# Patient Record
Sex: Female | Born: 1985 | Race: White | Hispanic: No | Marital: Single | Smoking: Current every day smoker
Health system: Southern US, Community
[De-identification: ages and names within clinical notes are randomized; demographics above are authoritative.]

## PROBLEM LIST (undated history)

## (undated) DIAGNOSIS — A6000 Herpesviral infection of urogenital system, unspecified: Secondary | ICD-10-CM

## (undated) DIAGNOSIS — N7011 Chronic salpingitis: Secondary | ICD-10-CM

## (undated) DIAGNOSIS — D649 Anemia, unspecified: Secondary | ICD-10-CM

## (undated) DIAGNOSIS — N12 Tubulo-interstitial nephritis, not specified as acute or chronic: Secondary | ICD-10-CM

## (undated) HISTORY — DX: Anemia, unspecified: D64.9

## (undated) HISTORY — DX: Tubulo-interstitial nephritis, not specified as acute or chronic: N12

## (undated) HISTORY — DX: Chronic salpingitis: N70.11

---

## 2011-07-26 ENCOUNTER — Emergency Department (HOSPITAL_COMMUNITY)
Admission: EM | Admit: 2011-07-26 | Discharge: 2011-07-27 | Disposition: A | Discharge status: Home / Self Care | Payer: BC Managed Care – PPO | Attending: Emergency Medicine | Admitting: Emergency Medicine

## 2011-07-26 ENCOUNTER — Emergency Department (HOSPITAL_COMMUNITY): Payer: BC Managed Care – PPO

## 2011-07-26 ENCOUNTER — Encounter: Payer: Self-pay | Admitting: Emergency Medicine

## 2011-07-26 DIAGNOSIS — M25579 Pain in unspecified ankle and joints of unspecified foot: Secondary | ICD-10-CM | POA: Insufficient documentation

## 2011-07-26 DIAGNOSIS — M79609 Pain in unspecified limb: Secondary | ICD-10-CM | POA: Insufficient documentation

## 2011-07-26 DIAGNOSIS — J45909 Unspecified asthma, uncomplicated: Secondary | ICD-10-CM | POA: Insufficient documentation

## 2011-07-26 DIAGNOSIS — M7662 Achilles tendinitis, left leg: Secondary | ICD-10-CM

## 2011-07-26 DIAGNOSIS — M7660 Achilles tendinitis, unspecified leg: Secondary | ICD-10-CM | POA: Insufficient documentation

## 2011-07-26 HISTORY — DX: Herpesviral infection of urogenital system, unspecified: A60.00

## 2011-07-26 NOTE — ED Notes (Signed)
PT. REPORTS LEFT ANKLE PAIN / CRAMPING  ONSET THIS EVENING ,  DENIES INJURY OR FALL . AMBULATORY.

## 2011-07-27 MED ORDER — IBUPROFEN 800 MG PO TABS
800.0000 mg | ORAL_TABLET | Freq: Once | ORAL | Status: AC
  Administered 2011-07-27: 800 mg via ORAL
  Filled 2011-07-27: qty 1

## 2011-07-27 MED ORDER — IBUPROFEN 800 MG PO TABS: 800.0000 mg | ORAL_TABLET | Freq: Three times a day (TID) | ORAL | Status: AC | PRN

## 2011-07-27 MED ORDER — HYDROCODONE-ACETAMINOPHEN 5-325 MG PO TABS
1.0000 | ORAL_TABLET | Freq: Once | ORAL | Status: AC
  Administered 2011-07-27: 1 via ORAL
  Filled 2011-07-27: qty 1

## 2011-07-27 MED ORDER — HYDROCODONE-ACETAMINOPHEN 5-325 MG PO TABS: ORAL_TABLET | ORAL | Status: DC

## 2011-07-27 NOTE — ED Notes (Signed)
Rx given x2 D/c instructions reviewed w/ pt - pt denies any further questions or concerns at present.   

## 2011-07-27 NOTE — ED Provider Notes (Signed)
History     CSN: [id number] Arrival date & time: 07/26/2011 10:38 PM   First MD Initiated Contact with Patient 07/27/11 0040      Chief Complaint  Patient presents with  . Ankle Pain    Patient is a 25 y.o. female presenting with ankle pain. The history is provided by the patient.  Ankle Pain  The incident occurred 3 to 5 hours ago. The incident occurred at home. The pain is present in the left ankle and left heel. The pain is at a severity of 6/10.  Patient reports severe pain to her left ankle she attempted to get out of bed this evening at approximately 9:00. Denies specific injury. Reported sudden sharp pain to her left posterior ankle over the Achilles tendon appear Patient notes that she is a runner, and jogs approximately 3 miles a day.  Past Medical History  Diagnosis Date  . Asthma   . Herpes genitalia     History reviewed. No pertinent past surgical history.  No family history on file.  History  Substance Use Topics  . Smoking status: Current Everyday Smoker  . Smokeless tobacco: Not on file  . Alcohol Use: Yes    OB History    Grav Para Term Preterm Abortions TAB SAB Ect Mult Living                  Review of Systems  Constitutional: Negative.   HENT: Negative.   Eyes: Negative.   Respiratory: Negative.   Cardiovascular: Negative.   Gastrointestinal: Negative.   Genitourinary: Negative.   Musculoskeletal: Negative.   Skin: Negative.   Neurological: Negative.   Hematological: Negative.   Psychiatric/Behavioral: Negative.     Allergies  Review of patient's allergies indicates no known allergies.  Home Medications   Current Outpatient Rx  Name Route Sig Dispense Refill  . ACYCLOVIR 200 MG PO CAPS Oral Take 200 mg by mouth 2 (two) times daily.      . DROSPIRENONE-ETHINYL ESTRADIOL 3-0.02 MG PO TABS Oral Take 1 tablet by mouth daily.        BP 100/65  Pulse 74  Temp 98.5 F (36.9 C)  Resp 16  SpO2 98%  LMP 07/12/2011  Physical Exam    Constitutional: She is oriented to person, place, and time. She appears well-developed and well-nourished.  HENT:  Head: Normocephalic and atraumatic.  Eyes: Conjunctivae are normal.  Cardiovascular: Normal rate.   Pulmonary/Chest: Effort normal.  Musculoskeletal: Normal range of motion.       Feet:       TTP over achilles tendon and medial posterior (L) ankle. . Negative Thompson's Test. No swelling or crepitus.   Neurological: She is alert and oriented to person, place, and time.  Skin: Skin is warm and dry. No erythema.  Psychiatric: She has a normal mood and affect.    ED Course  Procedures Discussed impression the patient. Will treat patient for Achilles tendinopathy. Ace wrap applied by me. Will discharge home with a seven-day regimenof anti-inflammatories and medication for pain. Will provide her with an ortho referral for follow up if symptoms do not improve. Patient is agreeable with plan.  Labs Reviewed - No data to display Dg Ankle Complete Left  07/26/2011  *RADIOLOGY REPORT*  Clinical Data: Ankle pain  LEFT ANKLE COMPLETE - 3+ VIEW  Comparison: None.  Findings: Three-view exam of the left ankle shows no evidence for fracture.  No subluxation.  Ankle mortise is preserved.  Sclerotic focus in the  medial malleolus has benign features and is probably a bone island.  IMPRESSION: No acute bony findings.  Original Report Authenticated By: ERIC A. MANSELL, M.D.     No diagnosis found.    MDM  HPI, Hx and PE c/w likely Achilles Tendonopathy       Leanne Chang, NP 07/27/11 [phone]

## 2011-07-27 NOTE — ED Provider Notes (Signed)
Medical screening examination/treatment/procedure(s) were performed by non-physician practitioner and as supervising physician I was immediately available for consultation/collaboration.   Shelda Jakes, MD 07/27/11 2004

## 2013-07-15 ENCOUNTER — Ambulatory Visit (INDEPENDENT_AMBULATORY_CARE_PROVIDER_SITE_OTHER): Payer: Self-pay | Admitting: Internal Medicine

## 2013-07-15 VITALS — BP 134/82 | HR 123 | Temp 102.9°F | Resp 20 | Ht 67.0 in | Wt 135.4 lb

## 2013-07-15 DIAGNOSIS — N10 Acute tubulo-interstitial nephritis: Secondary | ICD-10-CM

## 2013-07-15 DIAGNOSIS — R509 Fever, unspecified: Secondary | ICD-10-CM

## 2013-07-15 DIAGNOSIS — M549 Dorsalgia, unspecified: Secondary | ICD-10-CM

## 2013-07-15 LAB — POCT CBC
Granulocyte percent: 82.9 %G — AB (ref 37–80)
MCV: 95.1 fL (ref 80–97)
MID (cbc): 1.2 — AB (ref 0–0.9)
POC Granulocyte: 14.6 — AB (ref 2–6.9)
POC LYMPH PERCENT: 10.1 %L (ref 10–50)
POC MID %: 7 %M (ref 0–12)
Platelet Count, POC: 138 10*3/uL — AB (ref 142–424)
RDW, POC: 12.4 %

## 2013-07-15 LAB — POCT URINALYSIS DIPSTICK
Protein, UA: 30
Urobilinogen, UA: 4

## 2013-07-15 LAB — POCT UA - MICROSCOPIC ONLY
Casts, Ur, LPF, POC: NEGATIVE
Crystals, Ur, HPF, POC: NEGATIVE

## 2013-07-15 MED ORDER — KETOROLAC TROMETHAMINE 60 MG/2ML IM SOLN
60.0000 mg | Freq: Once | INTRAMUSCULAR | Status: AC
  Administered 2013-07-15: 60 mg via INTRAMUSCULAR

## 2013-07-15 MED ORDER — OXYCODONE-ACETAMINOPHEN 5-325 MG PO TABS: 1.0000 | ORAL_TABLET | Freq: Four times a day (QID) | ORAL | Status: DC | PRN

## 2013-07-15 MED ORDER — CIPROFLOXACIN HCL 500 MG PO TABS: 500.0000 mg | ORAL_TABLET | Freq: Two times a day (BID) | ORAL | Status: DC

## 2013-07-15 MED ORDER — CEFTRIAXONE SODIUM 1 G IJ SOLR
1.0000 g | Freq: Once | INTRAMUSCULAR | Status: AC
  Administered 2013-07-15: 1 g via INTRAMUSCULAR

## 2013-07-15 NOTE — Progress Notes (Signed)
Subjective:    Patient ID: Danielle Gonzalez, female    DOB: [birth year], 27 y.o.   MRN: [id number]  HPIc/o fever and back pain from LS to neck for 7 days,worse last 24hrs Mild ST No cough Nausea but no vom/diarr No gu sxt Aches in muscles No vis changes Feel waek and dizzy Chest hurts to deep breathe R post lat    Review of Systems     Objective:   Physical Exam BP 134/82  Pulse 123  Temp(Src) 102.9 F (39.4 C) (Oral)  Resp 20  Ht 5\' 7"  (1.702 m)  Wt 135 lb 6.4 oz (61.417 kg)  BMI 21.20 kg/m2  SpO2 97%  LMP 06/29/2013 Appears ill PERRLA/conj cl Tms,nose,thr clear No nodes Neck w/out rigidity Lungs clear Ht reg w/out M abd tender all over w/out masses,reb,guard Skin w/out rash Joints clear CN2-12 intact Pos cva tend R to perc  Results for orders placed in visit on 07/15/13  POCT CBC      Result Value Range   WBC 17.6 (*) 4.6 - 10.2 K/uL   Lymph, poc 1.8  0.6 - 3.4   POC LYMPH PERCENT 10.1  10 - 50 %L   MID (cbc) 1.2 (*) 0 - 0.9   POC MID % 7.0  0 - 12 %M   POC Granulocyte 14.6 (*) 2 - 6.9   Granulocyte percent 82.9 (*) 37 - 80 %G   RBC 3.48 (*) 4.04 - 5.48 M/uL   Hemoglobin 10.3 (*) 12.2 - 16.2 g/dL   HCT, POC 44.0 (*) 10.2 - 47.9 %   MCV 95.1  80 - 97 fL   MCH, POC 29.6  27 - 31.2 pg   MCHC 31.1 (*) 31.8 - 35.4 g/dL   RDW, POC 72.5     Platelet Count, POC 138 (*) 142 - 424 K/uL   MPV 8.1  0 - 99.8 fL  POCT UA - MICROSCOPIC ONLY      Result Value Range   WBC, Ur, HPF, POC 20-25     RBC, urine, microscopic 6-8     Bacteria, U Microscopic 2+     Mucus, UA neg     Epithelial cells, urine per micros TNTC     Crystals, Ur, HPF, POC neg     Casts, Ur, LPF, POC neg     Yeast, UA neg    POCT URINALYSIS DIPSTICK      Result Value Range   Color, UA yellow     Clarity, UA clear     Glucose, UA neg     Bilirubin, UA neg     Ketones, UA trace     Spec Grav, UA 1.010     Blood, UA small     pH, UA 6.0     Protein, UA 30     Urobilinogen, UA 4.0     Nitrite, UA neg     Leukocytes, UA small (1+)          Assessment & Plan:  Acute pyelonephritis - Plan: cefTRIAXone (ROCEPHIN) injection 1 g, Urine culture  Fever, unspecified - Plan: POCT CBC, POCT UA - Microscopic Only, POCT urinalysis dipstick, cefTRIAXone (ROCEPHIN) injection 1 g  Back pain - Plan: ketorolac (TORADOL) injection 60 mg  Meds ordered this encounter  Medications  . cefTRIAXone (ROCEPHIN) injection 1 g    Sig:   . ketorolac (TORADOL) injection 60 mg    Sig:   . oxyCODONE-acetaminophen (PERCOCET/ROXICET) 5-325 MG per tablet    Sig:  Take 1-2 tablets by mouth every 6 (six) hours as needed for severe pain.    Dispense:  30 tablet    Refill:  0  . ciprofloxacin (CIPRO) 500 MG tablet    Sig: Take 1 tablet (500 mg total) by mouth 2 (two) times daily.    Dispense:  20 tablet    Refill:  0   Close f/u

## 2013-07-16 ENCOUNTER — Telehealth: Payer: Self-pay

## 2013-07-16 ENCOUNTER — Ambulatory Visit: Payer: Self-pay | Admitting: Family Medicine

## 2013-07-16 VITALS — BP 104/58 | HR 113 | Temp 98.9°F | Resp 16 | Ht 65.5 in | Wt 133.6 lb

## 2013-07-16 DIAGNOSIS — M549 Dorsalgia, unspecified: Secondary | ICD-10-CM

## 2013-07-16 DIAGNOSIS — N10 Acute tubulo-interstitial nephritis: Secondary | ICD-10-CM

## 2013-07-16 DIAGNOSIS — R509 Fever, unspecified: Secondary | ICD-10-CM

## 2013-07-16 DIAGNOSIS — R112 Nausea with vomiting, unspecified: Secondary | ICD-10-CM

## 2013-07-16 LAB — POCT UA - MICROSCOPIC ONLY
Casts, Ur, LPF, POC: NEGATIVE
Crystals, Ur, HPF, POC: NEGATIVE
Mucus, UA: NEGATIVE
WBC, Ur, HPF, POC: NEGATIVE
Yeast, UA: NEGATIVE

## 2013-07-16 LAB — POCT URINALYSIS DIPSTICK
Bilirubin, UA: NEGATIVE
Glucose, UA: NEGATIVE
Ketones, UA: NEGATIVE
Nitrite, UA: NEGATIVE
Protein, UA: NEGATIVE
Spec Grav, UA: <=1.005
Urobilinogen, UA: 1
pH, UA: 6.5

## 2013-07-16 MED ORDER — CEFTRIAXONE SODIUM 1 G IJ SOLR
1.0000 g | Freq: Once | INTRAMUSCULAR | Status: AC
  Administered 2013-07-16: 1 g via INTRAMUSCULAR

## 2013-07-16 NOTE — Telephone Encounter (Signed)
Father called to say pt is in  Severe pain and he is stuck in traffic and trying to get home to her.   Best phone [phone]

## 2013-07-16 NOTE — Progress Notes (Signed)
   Subjective:    Patient ID: Danielle Gonzalez, female    DOB: 02/07/1986, 27 y.o.   MRN: [id number]  HPI Patient presents for f/u of acute pyelonephritis. She was seen here yesterday with fever, back pain, increased WBC count, and urinalysis findings concerning for acute pyelo. She was treated with rocephin and cipro. She presents today for recheck. Was feeling better this morning but then woke up from nap this afternoon and had more pain and also an episode of vomiting. Last took antibiotic this morning. Had increased pain after vomiting. Patient does continue to have intermittent mild nausea. Has been drinking water okay and keeping it down. Urinating okay, no blood in the urine. No abdominal pain currently. Significantly improved from yesterday overall according to father and MA. Has been able to keep down abx. Positive pleuritic chest pain but no SOB, cough.  Review of Systems  Constitutional: Positive for fever, activity change, appetite change and fatigue.  Cardiovascular: Positive for chest pain.  All other systems reviewed and are negative.      Objective:   Physical Exam  Constitutional: She is oriented to person, place, and time. She appears well-developed and well-nourished. She appears distressed.  HENT:  Head: Normocephalic and atraumatic.  Eyes: Conjunctivae are normal. Pupils are equal, round, and reactive to light. No scleral icterus.  Neck: Neck supple.  Cardiovascular: Regular rhythm, normal heart sounds and intact distal pulses.   Pulmonary/Chest: Effort normal and breath sounds normal. No respiratory distress. She has no wheezes. She has no rales.  Abdominal: Soft. She exhibits no distension. There is no tenderness. There is no rebound and no guarding.  Lymphadenopathy:    She has no cervical adenopathy.  Neurological: She is alert and oriented to person, place, and time.  Skin: Skin is warm and dry. She is not diaphoretic.  Psychiatric: She has a normal mood and affect.  Her behavior is normal.  She appears to be in moderate discomfort    Assessment & Plan:  #1. Acute Pyelonephritis - Repeat dose IM rocephin 1 g today - Continue PO cipro - Urine culture pending - Return tomorrow if continued fever, vomiting, not improving by 48 hrs abx - Improved today compared to yesterday.  - Also in ddx is pneumonia (pleuritic pain), bacteremia - ER return precautions given

## 2013-07-16 NOTE — Patient Instructions (Signed)
Thank you for coming in today We will give you one more shot of rocephin Continue ciprofloxacin We should have your urine culture results back by Friday  Please continue to push fluids gradually Okay to take ibuprofen or tylenol  Return tomorrow evening if persistent fever > 100.5, persistent vomiting, or just not getting better  Pyelonephritis, Adult Pyelonephritis is a kidney infection. In general, there are 2 main types of pyelonephritis:  Infections that come on quickly without any warning (acute pyelonephritis).  Infections that persist for a long period of time (chronic pyelonephritis). CAUSES  Two main causes of pyelonephritis are:  Bacteria traveling from the bladder to the kidney. This is a problem especially in pregnant women. The urine in the bladder can become filled with bacteria from multiple causes, including:  Inflammation of the prostate gland (prostatitis).  Sexual intercourse in females.  Bladder infection (cystitis).  Bacteria traveling from the bloodstream to the tissue part of the kidney. Problems that may increase your risk of getting a kidney infection include:  Diabetes.  Kidney stones or bladder stones.  Cancer.  Catheters placed in the bladder.  Other abnormalities of the kidney or ureter. SYMPTOMS   Abdominal pain.  Pain in the side or flank area.  Fever.  Chills.  Upset stomach.  Blood in the urine (dark urine).  Frequent urination.  Strong or persistent urge to urinate.  Burning or stinging when urinating. DIAGNOSIS  Your caregiver may diagnose your kidney infection based on your symptoms. A urine sample may also be taken. TREATMENT  In general, treatment depends on how severe the infection is.   If the infection is mild and caught early, your caregiver may treat you with oral antibiotics and send you home.  If the infection is more severe, the bacteria may have gotten into the bloodstream. This will require intravenous  (IV) antibiotics and a hospital stay. Symptoms may include:  High fever.  Severe flank pain.  Shaking chills.  Even after a hospital stay, your caregiver may require you to be on oral antibiotics for a period of time.  Other treatments may be required depending upon the cause of the infection. HOME CARE INSTRUCTIONS   Take your antibiotics as directed. Finish them even if you start to feel better.  Make an appointment to have your urine checked to make sure the infection is gone.  Drink enough fluids to keep your urine clear or pale yellow.  Take medicines for the bladder if you have urgency and frequency of urination as directed by your caregiver. SEEK IMMEDIATE MEDICAL CARE IF:   You have a fever or persistent symptoms for more than 2-3 days.  You have a fever and your symptoms suddenly get worse.  You are unable to take your antibiotics or fluids.  You develop shaking chills.  You experience extreme weakness or fainting.  There is no improvement after 2 days of treatment. MAKE SURE YOU:  Understand these instructions.  Will watch your condition.  Will get help right away if you are not doing well or get worse. Document Released: 07/31/2005 Document Revised: 01/30/2012 Document Reviewed: 01/04/2011 [facility] Patient Information 2014 [place], [place].

## 2013-07-16 NOTE — Telephone Encounter (Signed)
Please advise, ER or return here?

## 2013-07-17 LAB — URINE CULTURE

## 2015-11-16 ENCOUNTER — Encounter (HOSPITAL_COMMUNITY): Payer: Self-pay

## 2015-11-16 ENCOUNTER — Inpatient Hospital Stay (HOSPITAL_COMMUNITY)
Admission: AD | Admit: 2015-11-16 | Discharge: 2015-11-16 | Disposition: A | Discharge status: Home / Self Care | Payer: No Typology Code available for payment source | Source: Ambulatory Visit | Attending: Obstetrics & Gynecology | Admitting: Obstetrics & Gynecology

## 2015-11-16 ENCOUNTER — Emergency Department (HOSPITAL_COMMUNITY)
Admission: EM | Admit: 2015-11-16 | Discharge: 2015-11-16 | Disposition: A | Discharge status: Home / Self Care | Payer: No Typology Code available for payment source | Source: Home / Self Care | Attending: Emergency Medicine | Admitting: Emergency Medicine

## 2015-11-16 DIAGNOSIS — Z79899 Other long term (current) drug therapy: Secondary | ICD-10-CM | POA: Insufficient documentation

## 2015-11-16 DIAGNOSIS — N939 Abnormal uterine and vaginal bleeding, unspecified: Secondary | ICD-10-CM

## 2015-11-16 DIAGNOSIS — F17210 Nicotine dependence, cigarettes, uncomplicated: Secondary | ICD-10-CM | POA: Insufficient documentation

## 2015-11-16 DIAGNOSIS — Z8619 Personal history of other infectious and parasitic diseases: Secondary | ICD-10-CM | POA: Insufficient documentation

## 2015-11-16 DIAGNOSIS — Z3202 Encounter for pregnancy test, result negative: Secondary | ICD-10-CM | POA: Insufficient documentation

## 2015-11-16 DIAGNOSIS — J45909 Unspecified asthma, uncomplicated: Secondary | ICD-10-CM

## 2015-11-16 LAB — COMPREHENSIVE METABOLIC PANEL
ALT: 20 U/L (ref 14–54)
AST: 29 U/L (ref 15–41)
Albumin: 4.3 g/dL (ref 3.5–5.0)
Alkaline Phosphatase: 44 U/L (ref 38–126)
Anion gap: 8 (ref 5–15)
BILIRUBIN TOTAL: 0.9 mg/dL (ref 0.3–1.2)
BUN: 17 mg/dL (ref 6–20)
CHLORIDE: 105 mmol/L (ref 101–111)
CO2: 26 mmol/L (ref 22–32)
CREATININE: 0.84 mg/dL (ref 0.44–1.00)
Calcium: 9 mg/dL (ref 8.9–10.3)
Glucose, Bld: 92 mg/dL (ref 65–99)
POTASSIUM: 4.1 mmol/L (ref 3.5–5.1)
Sodium: 139 mmol/L (ref 135–145)
TOTAL PROTEIN: 7 g/dL (ref 6.5–8.1)

## 2015-11-16 LAB — CBC WITH DIFFERENTIAL/PLATELET
BASOS ABS: 0 10*3/uL (ref 0.0–0.1)
Basophils Relative: 0 %
EOS ABS: 0.2 10*3/uL (ref 0.0–0.7)
EOS PCT: 3 %
HCT: 36.1 % (ref 36.0–46.0)
HEMOGLOBIN: 12.9 g/dL (ref 12.0–15.0)
LYMPHS PCT: 18 %
Lymphs Abs: 1.4 10*3/uL (ref 0.7–4.0)
MCH: 32.2 pg (ref 26.0–34.0)
MCHC: 35.7 g/dL (ref 30.0–36.0)
MCV: 90 fL (ref 78.0–100.0)
Monocytes Absolute: 0.7 10*3/uL (ref 0.1–1.0)
Monocytes Relative: 9 %
NEUTROS PCT: 70 %
Neutro Abs: 5.6 10*3/uL (ref 1.7–7.7)
PLATELETS: 209 10*3/uL (ref 150–400)
RBC: 4.01 MIL/uL (ref 3.87–5.11)
RDW: 12 % (ref 11.5–15.5)
WBC: 8 10*3/uL (ref 4.0–10.5)

## 2015-11-16 LAB — CBC
HEMATOCRIT: 36.4 % (ref 36.0–46.0)
HEMOGLOBIN: 12.8 g/dL (ref 12.0–15.0)
MCH: 31.8 pg (ref 26.0–34.0)
MCHC: 35.2 g/dL (ref 30.0–36.0)
MCV: 90.3 fL (ref 78.0–100.0)
Platelets: 222 10*3/uL (ref 150–400)
RBC: 4.03 MIL/uL (ref 3.87–5.11)
RDW: 12.1 % (ref 11.5–15.5)
WBC: 7.1 10*3/uL (ref 4.0–10.5)

## 2015-11-16 LAB — POC URINE PREG, ED: PREG TEST UR: NEGATIVE

## 2015-11-16 MED ORDER — MEDROXYPROGESTERONE ACETATE 10 MG PO TABS
10.0000 mg | ORAL_TABLET | Freq: Once | ORAL | Status: AC
  Administered 2015-11-16: 10 mg via ORAL
  Filled 2015-11-16: qty 1

## 2015-11-16 MED ORDER — MEDROXYPROGESTERONE ACETATE 10 MG PO TABS: 10.0000 mg | ORAL_TABLET | Freq: Every day | ORAL | Status: DC

## 2015-11-16 NOTE — Discharge Instructions (Signed)

## 2015-11-16 NOTE — MAU Note (Signed)
Pt was seen @ [facility] this morning, pt states she was sent to MAU.  Pt C/O heavy bleeding since 2300 last night.  Denies pain.

## 2015-11-16 NOTE — MAU Provider Note (Signed)
Chief Complaint: Vaginal Bleeding   First Provider Initiated Contact with Patient 11/16/15 1534     SUBJECTIVE HPI: Danielle Gonzalez is a 30 y.o. non-pregnant female who presents to Maternity Admissions reporting heavy vaginal bleeding since last night. Was seen at [facility] ED for this problem this morning. Hgb 12.9. ED MD consulted Dr. Macon Large OB/GYN who reviewed chart and determined that pt was stable for D/C w/ OP Gyn F/U. No [place] indicated, but pt's family was not satisfied with this plan and brought her to MAU.    Pt states she has soaked 10 tampons and 2 pads since last night. Normally has regular monthly periods w/ moderate flow. Has only missed one other period in her life last September. Missed period last month. Had normal period last week, stopped for a few days, then started bleeding heavily last night.   Duration: >24 hours Associated signs and symptoms: Pos for Mild cramping. Neg for dizziness, passing clots, vaginal discharge.   Past Medical History  Diagnosis Date  . Asthma   . Herpes genitalia    OB History  No data available   History reviewed. No pertinent past surgical history. Social History   Social History  . Marital Status: Single    Spouse Name: N/A  . Number of Children: N/A  . Years of Education: N/A   Occupational History  . Not on file.   Social History Main Topics  . Smoking status: Current Every Day Smoker -- 0.50 packs/day    Types: Cigarettes  . Smokeless tobacco: Never Used  . Alcohol Use: Yes     Comment: 1-2 beers daily  . Drug Use: No  . Sexual Activity: Not on file   Other Topics Concern  . Not on file   Social History Narrative   No current facility-administered medications on file prior to encounter.   Current Outpatient Prescriptions on File Prior to Encounter  Medication Sig Dispense Refill  . fexofenadine (ALLEGRA) 180 MG tablet Take 180 mg by mouth daily.    Marland Kitchen ibuprofen (ADVIL,MOTRIN) 200 MG tablet Take 600 mg by mouth  every 8 (eight) hours as needed for fever, headache, mild pain, moderate pain or cramping.    . IRON PO Take 1 tablet by mouth daily.     No Known Allergies  I have reviewed the past Medical Hx, Surgical Hx, Social Hx, Allergies and Medications.   Review of Systems  Constitutional: Negative for fever, chills and fatigue.  Gastrointestinal: Positive for abdominal pain. Negative for nausea, vomiting, diarrhea and constipation.  Genitourinary: Positive for vaginal bleeding and menstrual problem. Negative for dysuria, vaginal discharge, vaginal pain and pelvic pain.  Neurological: Positive for headaches (mild). Negative for dizziness, weakness and light-headedness.    OBJECTIVE Patient Vitals for the past 24 hrs:  BP Temp Temp src Pulse Resp  11/16/15 1458 98/61 mmHg 98.1 F (36.7 C) Oral 84 16   Constitutional: Well-developed, well-nourished female in no acute distress. No pallor.  Cardiovascular: normal rate Respiratory: normal rate and effort.  GI: Abd soft, non-tender. MS: Extremities nontender, no edema, normal ROM Neurologic: Alert and oriented x 4.  GU: Deferred due to recent exam. Moderate amount of blood on pad per RN.  LAB RESULTS Results for orders placed or performed during the hospital encounter of 11/16/15 (from the past 24 hour(s))  CBC     Status: None   Collection Time: 11/16/15  4:26 PM  Result Value Ref Range   WBC 7.1 4.0 - 10.5 K/uL  RBC 4.03 3.87 - 5.11 MIL/uL   Hemoglobin 12.8 12.0 - 15.0 g/dL   HCT 21.336.4 08.636.0 - 57.846.0 %   MCV 90.3 78.0 - 100.0 fL   MCH 31.8 26.0 - 34.0 pg   MCHC 35.2 30.0 - 36.0 g/dL   RDW 46.912.1 62.911.5 - 52.815.5 %   Platelets 222 150 - 400 K/uL    IMAGING No results found.  MAU COURSE CBC.   Discussed history, exam, [facility] with Dr. Macon LargeAnyanwu. No indication for ultrasound at this time.  MDM - 30 year old non-pregnant female with isolated episode of abnormal uterine bleeding, hemodynamically stable, CBC stable. No  emergent condition present. No indication for extensive workup, ultrasound at this time.  ASSESSMENT 1. Abnormal uterine bleeding    PLAN Discharge home in stable condition per consult with Dr. Macon LargeAnyanwu. Bleeding Precautions Rx Provera. Discussed withdrawal bleed after Premarin stops. Brief discussion of indications for further workup and treatment for ongoing problems with abnormal uterine bleeding.     Follow-up Information    [facility]   Specialty:  Obstetrics and Gynecology   Why:  2 weeks if no improvement    Contact information:   [address] [place] [phone]      Follow [facility] with [facility] [place] [facility].   Why:  As needed in emergencies   Contact information:   [address] [id number] mc [place] [phone] [phone]       Medication List    TAKE these medications        fexofenadine 180 MG tablet  Commonly known as:  ALLEGRA  Take 180 mg by mouth daily.     ibuprofen 200 MG tablet  Commonly known as:  ADVIL,MOTRIN  Take 600 mg by mouth every 8 (eight) hours as needed for fever, headache, mild pain, moderate pain or cramping.     IRON PO  Take 1 tablet by mouth daily.     medroxyPROGESTERone 10 MG tablet  Commonly known as:  PROVERA  Take 1 tablet (10 mg total) by mouth daily. Use for ten days     multivitamin with minerals Tabs tablet  Take 1 tablet by mouth daily.       [place] Siegfried Vi[place], [place] 11/16/2015  5:58 PM

## 2015-11-16 NOTE — ED Notes (Signed)
Patient reports that she began having heavy vaginal bleeding with clots. Patient states she had a period last week, but had not had one for 2 months prior to last week.

## 2015-11-16 NOTE — ED Provider Notes (Signed)
CSN: [id number]     Arrival date & time 11/16/15  1012 History   First MD Initiated Contact with Patient 11/16/15 1037     Chief Complaint  Patient presents with  . Vaginal Bleeding      Patient is a 30 y.o. female presenting with vaginal bleeding. The history is provided by the patient.  Vaginal Bleeding Associated symptoms: no abdominal pain, no back pain and no nausea   Patient presents with vaginal bleeding. States she's been off her birth control pills for a couple years. States that around 2 weeks ago she had a normal menses however she had missed the month before that. States that that menses lasted around 5 days was normal. She states that yesterday she began to have heavy bleeding. States she's passed some clots. States she feels fine otherwise. States there is a change she could be pregnant. No other bleeding. No bruising easily. No abdominal pain. No lightheadedness or dizziness. States she also missed a menses in September.  Past Medical History  Diagnosis Date  . Asthma   . Herpes genitalia    History reviewed. No pertinent past surgical history. History reviewed. No pertinent family history. Social History  Substance Use Topics  . Smoking status: Current Every Day Smoker -- 0.50 packs/day    Types: Cigarettes  . Smokeless tobacco: Never Used  . Alcohol Use: Yes     Comment: 1-2 beers daily   OB History    No data available     Review of Systems  Constitutional: Negative for activity change and appetite change.  Eyes: Negative for pain.  Respiratory: Negative for chest tightness and shortness of breath.   Cardiovascular: Negative for chest pain and leg swelling.  Gastrointestinal: Negative for nausea, abdominal pain and diarrhea.  Genitourinary: Positive for vaginal bleeding. Negative for flank pain.  Musculoskeletal: Negative for back pain and neck stiffness.  Skin: Negative for rash.  Neurological: Negative for weakness, numbness and headaches.   Psychiatric/Behavioral: Negative for behavioral problems.      Allergies  Review of patient's allergies indicates no known allergies.  Home Medications   Prior to Admission medications   Medication Sig Start Date End Date Taking? Authorizing Provider  fexofenadine (ALLEGRA) 180 MG tablet Take 180 mg by mouth daily.   Yes Historical Provider, MD  ibuprofen (ADVIL,MOTRIN) 200 MG tablet Take 600 mg by mouth every 8 (eight) hours as needed for fever, headache, mild pain, moderate pain or cramping.   Yes Historical Provider, MD  IRON PO Take 1 tablet by mouth daily.   Yes Historical Provider, MD  Multiple Vitamins-Minerals (ADULT GUMMY) CHEW Chew 2 capsules by mouth daily.   Yes Historical Provider, MD   BP 108/50 mmHg  Pulse 91  Temp(Src) 98.2 F (36.8 C) (Oral)  Resp 18  SpO2 99%  LMP 11/15/2015 Physical Exam  Constitutional: She appears well-developed.  HENT:  Head: Atraumatic.  Neck: Neck supple.  Cardiovascular: Normal rate.   Pulmonary/Chest: Effort normal.  Abdominal: Soft.  Neurological: She is alert.  Skin: Skin is warm.  Pelvic exam showed some pooled blood was small clot. Slight cervical bleeding. No cervical motion tenderness or mass.  ED Course  Procedures (including critical care time) Labs Review Labs Reviewed  COMPREHENSIVE METABOLIC PANEL  CBC WITH DIFFERENTIAL/PLATELET  RPR  HIV ANTIBODY (ROUTINE TESTING)  POC URINE PREG, ED    Imaging Review No results found. I have personally reviewed and evaluated these images and lab results as part of my medical decision-making.  EKG Interpretation None      MDM   Final diagnoses:  Vaginal bleeding    Patient with vaginal bleeding. Began yesterday. Normal hemoglobin vitals. Patient is asymptomatic except for the bleeding. Patient appears stable to discharge to follow-up with women's clinic. Negative pregnancy test. Discussed with the patient's mother in [place] and a friend of hers who is a Engineer, civil (consulting).  They think that she needs emergent ultrasound and emergent consult by gynecology. I informed them at this point the patient appears stable and will need follow-up. I discussed with the gynecologist on call for [facility]. She took the patient's information in the office well follow-up for appointment. She was told to follow-up with women's hospital if she continues to bleed. Patient appears satisfied with this but some family members have been rolling their eyes at me. The gynecologist stated it could be a week or 2 before the appointment gets done.    Benjiman Core, MD 11/16/15 1316

## 2015-11-17 LAB — RPR: RPR: NONREACTIVE

## 2015-11-17 LAB — HIV ANTIBODY (ROUTINE TESTING W REFLEX): HIV Screen 4th Generation wRfx: NONREACTIVE

## 2015-11-29 ENCOUNTER — Encounter: Payer: No Typology Code available for payment source | Admitting: Student

## 2016-01-18 ENCOUNTER — Ambulatory Visit (INDEPENDENT_AMBULATORY_CARE_PROVIDER_SITE_OTHER): Payer: No Typology Code available for payment source | Admitting: Emergency Medicine

## 2016-01-18 VITALS — BP 128/80 | HR 98 | Temp 97.6°F | Resp 17 | Ht 66.0 in | Wt 140.0 lb

## 2016-01-18 DIAGNOSIS — N3000 Acute cystitis without hematuria: Secondary | ICD-10-CM

## 2016-01-18 DIAGNOSIS — R300 Dysuria: Secondary | ICD-10-CM | POA: Diagnosis not present

## 2016-01-18 DIAGNOSIS — M545 Low back pain, unspecified: Secondary | ICD-10-CM

## 2016-01-18 LAB — POCT CBC
GRANULOCYTE PERCENT: 89.3 % — AB (ref 37–80)
HCT, POC: 38.3 % (ref 37.7–47.9)
Hemoglobin: 13.7 g/dL (ref 12.2–16.2)
Lymph, poc: 1.2 (ref 0.6–3.4)
MCH: 32.1 pg — AB (ref 27–31.2)
MCHC: 35.6 g/dL — AB (ref 31.8–35.4)
MCV: 90.2 fL (ref 80–97)
MID (CBC): 0.9 (ref 0–0.9)
MPV: 6.9 fL (ref 0–99.8)
PLATELET COUNT, POC: 206 10*3/uL (ref 142–424)
POC Granulocyte: 17 — AB (ref 2–6.9)
POC LYMPH %: 6.2 % — AB (ref 10–50)
POC MID %: 4.5 % (ref 0–12)
RBC: 4.25 M/uL (ref 4.04–5.48)
RDW, POC: 12.2 %
WBC: 19 10*3/uL — AB (ref 4.6–10.2)

## 2016-01-18 LAB — POC MICROSCOPIC URINALYSIS (UMFC): MUCUS RE: ABSENT

## 2016-01-18 LAB — POCT URINALYSIS DIP (MANUAL ENTRY)
Bilirubin, UA: NEGATIVE
Glucose, UA: NEGATIVE
Ketones, POC UA: NEGATIVE
Nitrite, UA: NEGATIVE
Protein Ur, POC: 30 — AB
UROBILINOGEN UA: 0.2
pH, UA: 5

## 2016-01-18 LAB — POCT URINE PREGNANCY: Preg Test, Ur: NEGATIVE

## 2016-01-18 MED ORDER — CEFTRIAXONE SODIUM 1 G IJ SOLR
1.0000 g | Freq: Once | INTRAMUSCULAR | Status: AC
  Administered 2016-01-18: 1 g via INTRAMUSCULAR

## 2016-01-18 MED ORDER — CIPROFLOXACIN HCL 500 MG PO TABS: 500.0000 mg | ORAL_TABLET | Freq: Two times a day (BID) | ORAL | Status: DC

## 2016-01-18 NOTE — Patient Instructions (Addendum)
Urinary Tract Infection Urinary tract infections (UTIs) can develop anywhere along your urinary tract. Your urinary tract is your body's drainage system for removing wastes and extra water. Your urinary tract includes two kidneys, two ureters, a bladder, and a urethra. Your kidneys are a pair of bean-shaped organs. Each kidney is about the size of your fist. They are located below your ribs, one on each side of your spine. CAUSES Infections are caused by microbes, which are microscopic organisms, including fungi, viruses, and bacteria. These organisms are so small that they can only be seen through a microscope. Bacteria are the microbes that most commonly cause UTIs. SYMPTOMS  Symptoms of UTIs may vary by age and gender of the patient and by the location of the infection. Symptoms in young women typically include a frequent and intense urge to urinate and a painful, burning feeling in the bladder or urethra during urination. Older women and men are more likely to be tired, shaky, and weak and have muscle aches and abdominal pain. A fever may mean the infection is in your kidneys. Other symptoms of a kidney infection include pain in your back or sides below the ribs, nausea, and vomiting. DIAGNOSIS To diagnose a UTI, your caregiver will ask you about your symptoms. Your caregiver will also ask you to provide a urine sample. The urine sample will be tested for bacteria and white blood cells. White blood cells are made by your body to help fight infection. TREATMENT  Typically, UTIs can be treated with medication. Because most UTIs are caused by a bacterial infection, they usually can be treated with the use of antibiotics. The choice of antibiotic and length of treatment depend on your symptoms and the type of bacteria causing your infection. HOME CARE INSTRUCTIONS  If you were prescribed antibiotics, take them exactly as your caregiver instructs you. Finish the medication even if you feel better after  you have only taken some of the medication.  Drink enough water and fluids to keep your urine clear or pale yellow.  Avoid caffeine, tea, and carbonated beverages. They tend to irritate your bladder.  Empty your bladder often. Avoid holding urine for long periods of time.  Empty your bladder before and after sexual intercourse.  After a bowel movement, women should cleanse from front to back. Use each tissue only once. SEEK MEDICAL CARE IF:   You have back pain.  You develop a fever.  Your symptoms do not begin to resolve within 3 days. SEEK IMMEDIATE MEDICAL CARE IF:   You have severe back pain or lower abdominal pain.  You develop chills.  You have nausea or vomiting.  You have continued burning or discomfort with urination. MAKE SURE YOU:   Understand these instructions.  Will watch your condition.  Will get help right away if you are not doing well or get worse.   This information is not intended to replace advice given to you by your health care provider. Make sure you discuss any questions you have with your health care provider.   Document Released: 05/10/2005 Document Revised: 04/21/2015 Document Reviewed: 09/08/2011 Elsevier Interactive Patient Education 2016 Else[employer] Inc[employer] Pyelonephritis, Adult Pyelonephritis is a kidney infection. The kidneys are the organs that filter a person's blood and move waste out of the bloodstream and into the urine. Urine passes from the kidneys, through the ureters, and into the bladder. There are two main types of pyelonephritis:  Infections that come on quickly without any warning (acute pyelonephritis).  Infections that  last for a long period of time (chronic pyelonephritis). °In most cases, the infection clears up with treatment and does not cause further problems. More severe infections or chronic infections can sometimes spread to the bloodstream or lead to other problems with the kidneys. °CAUSES °This condition is usually  caused by: °· Bacteria traveling from the bladder to the kidney through infected urine. The urine in the bladder can become infected with bacteria from: °¨ Bladder infection (cystitis). °¨ Inflammation of the prostate gland (prostatitis). °¨ Sexual intercourse, in females. °· Bacteria traveling from the bloodstream to the kidney. °RISK FACTORS °This condition is more likely to develop in: °· Pregnant women. °· Older people. °· People who have diabetes. °· People who have kidney stones or bladder stones. °· People who have other abnormalities of the kidney or ureter. °· People who have a catheter placed in the bladder. °· People who have cancer. °· People who are sexually active. °· Women who use spermicides. °· People who have had a prior urinary tract infection. °SYMPTOMS °Symptoms of this condition include: °· Frequent urination. °· Strong or persistent urge to urinate. °· Burning or stinging when urinating. °· Abdominal pain. °· Back pain. °· Pain in the side or flank area. °· Fever. °· Chills. °· Blood in the urine, or dark urine. °· Nausea. °· Vomiting. °DIAGNOSIS °This condition may be diagnosed based on: °· Medical history and physical exam. °· Urine tests. °· Blood tests. °You may also have imaging tests of the kidneys, such as an ultrasound or CT scan. °TREATMENT °Treatment for this condition may depend on the severity of the infection. °· If the infection is mild and is found early, you may be treated with antibiotic medicines taken by mouth. You will need to drink fluids to remain hydrated. °· If the infection is more severe, you may need to stay in the hospital and receive antibiotics given directly into a vein through an IV tube. You may also need to receive fluids through an IV tube if you are not able to remain hydrated. After your hospital stay, you may need to take oral antibiotics for a period of time. °Other treatments may be required, depending on the cause of the infection. °HOME CARE  INSTRUCTIONS °Medicines °· Take over-the-counter and prescription medicines only as told by your health care provider. °· If you were prescribed an antibiotic medicine, take it as told by your health care provider. Do not stop taking the antibiotic even if you start to feel better. °General Instructions °· Drink enough fluid to keep your urine clear or pale yellow. °· Avoid caffeine, tea, and carbonated beverages. They tend to irritate the bladder. °· Urinate often. Avoid holding in urine for long periods of time. °· Urinate before and after sex. °· After a bowel movement, women should cleanse from front to back. Use each tissue only once. °· Keep all follow-up visits as told by your health care provider. This is important. °SEEK MEDICAL CARE IF: °· Your symptoms do not get better after 2 days of treatment. °· Your symptoms get worse. °· You have a fever. °SEEK IMMEDIATE MEDICAL CARE IF: °· You are unable to take your antibiotics or fluids. °· You have shaking chills. °· You vomit. °· You have severe flank or back pain. °· You have extreme weakness or fainting. °  °This information is not intended to replace advice given to you by your health care provider. Make sure you discuss any questions you have with your health   care provider. °  °Document Released: 07/31/2005 Document Revised: 04/21/2015 Document Reviewed: 11/23/2014 °Elsevier Interactive Patient Education ©2016 Elsevier Inc. ° °

## 2016-01-18 NOTE — Progress Notes (Addendum)
By signing my name below, I, Danielle Gonzalez, attest that this documentation has been prepared under the direction and in the presence of Danielle ChrisSteven Kenny Stern, MD.  Electronically Signed: Arvilla MarketMesha Gonzalez, Medical Scribe. 01/18/2016. 1:37 PM.  Chief Complaint:  Chief Complaint  Patient presents with  . Dysuria    fever, chills, burning with urination x 2 days    HPI: Danielle ReaperBrianna Gonzalez is a 30 y.o. female [facility] of dysuria. Pt is concerned about getting kidney infection. Pt reports it began when she started her LMP Thursday. Pt reports dysuria, frequency, lower back pain onset 1 day, chills, fever, fatigue, and abdominal pain. Pt had a kidney infection about 3 years ago- end of November. She had it treated at the urgent care with 3 shots. Pt has been drinking lots of water.  Pt is not on birth control and uses condoms. Pt denies possibility of pregnancy. Pt denies have Pt states she has a bulging disk.  Pt had fever of 102 in 2014 she had acute pyelonephritis and treated with cipro and rocephin. Urine culture grew culture - E. Coli sensitive to all abx but penicillin  Past Medical History  Diagnosis Date  . Asthma   . Herpes genitalia    No past surgical history on file. Social History   Social History  . Marital Status: Single    Spouse Name: N/A  . Number of Children: N/A  . Years of Education: N/A   Social History Main Topics  . Smoking status: Current Every Day Smoker -- 0.50 packs/day    Types: Cigarettes  . Smokeless tobacco: Never Used  . Alcohol Use: Yes     Comment: 1-2 beers daily  . Drug Use: No  . Sexual Activity: Not Asked   Other Topics Concern  . None   Social History Narrative   No family history on file. No Known Allergies Prior to Admission medications   Medication Sig Start Date End Date Taking? Authorizing Provider  fexofenadine (ALLEGRA) 180 MG tablet Take 180 mg by mouth daily.   Yes Historical Provider, MD  ibuprofen  (ADVIL,MOTRIN) 200 MG tablet Take 600 mg by mouth every 8 (eight) hours as needed for fever, headache, mild pain, moderate pain or cramping.   Yes Historical Provider, MD  IRON PO Take 1 tablet by mouth daily.   Yes Historical Provider, MD  Multiple Vitamin (MULTIVITAMIN WITH MINERALS) TABS tablet Take 1 tablet by mouth daily.   Yes Historical Provider, MD     ROS: The patient denies night sweats, unintentional weight loss, chest pain, palpitations, wheezing, dyspnea on exertion, nausea, vomiting, hematuria, melena, numbness, weakness, or tingling. Pt reports fevers, chills, abdominal pain, and dysuria.  All other systems have been reviewed and were otherwise negative with the exception of those mentioned in the HPI and as above.    PHYSICAL EXAM: Filed Vitals:   01/18/16 1210  BP: 128/80  Pulse: 98  Temp: 97.6 F (36.4 C)  Resp: 17   Body mass index is 22.61 kg/(m^2).   General: Alert, no acute distress HEENT:  Normocephalic, atraumatic, oropharynx patent. Eye[facility] Cardiovascular:  Regular rate and rhythm, no rubs murmurs or gallops.  No Carotid bruits, radial pulse intact. No pedal edema.  Respiratory: Clear to auscultation bilaterally.  No wheezes, rales, or rhonchi.  No cyanosis, no use of accessory musculature Abdominal: No organomegaly, abdomen is soft and non-tender, positive bowel sounds.  No masses. Musculoskeletal: Gait intact. No edema, tenderness. Mild tenderness  2-3 inches above right SI joint Skin: No rashes. Neurologic: Facial musculature symmetric. Psychiatric: Patient acts appropriately throughout our interaction. Lymphatic: No cervical or submandibular lymphadenopathy   LABS: Results for orders placed or performed in visit on 01/18/16  POCT urinalysis dipstick  Result Value Ref Range   Color, UA yellow yellow   Clarity, UA cloudy (A) clear   Glucose, UA negative negative   Bilirubin, UA negative negative   Ketones, POC UA negative negative    Spec Grav, UA <=1.005    Blood, UA small (A) negative   pH, UA 5.0    Protein Ur, POC =30 (A) negative   Urobilinogen, UA 0.2    Nitrite, UA Negative Negative   Leukocytes, UA small (1+) (A) Negative  POCT Microscopic Urinalysis ([facility])  Result Value Ref Range   WBC,UR,HPF,POC Too numerous to count  (A) None WBC/hpf   RBC,UR,HPF,POC None None RBC/hpf   Bacteria Few (A) None, Too numerous to count   Mucus Absent Absent   Epithelial Cells, UR Per Microscopy Few (A) None, Too numerous to count cells/hpf  POCT CBC  Result Value Ref Range   WBC 19.0 (A) 4.6 - 10.2 K/uL   Lymph, poc 1.2 0.6 - 3.4   POC LYMPH PERCENT 6.2 (A) 10 - 50 %L   MID (cbc) 0.9 0 - 0.9   POC MID % 4.5 0 - 12 %M   POC Granulocyte 17.0 (A) 2 - 6.9   Granulocyte percent 89.3 (A) 37 - 80 %G   RBC 4.25 4.04 - 5.48 M/uL   Hemoglobin 13.7 12.2 - 16.2 g/dL   HCT, POC 16.1 09.6 - 47.9 %   MCV 90.2 80 - 97 fL   MCH, POC 32.1 (A) 27 - 31.2 pg   MCHC 35.6 (A) 31.8 - 35.4 g/dL   RDW, POC 04.5 %   Platelet Count, POC 206 142 - 424 K/uL   MPV 6.9 0 - 99.8 fL  POCT urine pregnancy  Result Value Ref Range   Preg Test, Ur Negative Negative   Meds ordered this encounter  Medications  . cefTRIAXone (ROCEPHIN) injection 1 g    Sig:   . ciprofloxacin (CIPRO) 500 MG tablet    Sig: Take 1 tablet (500 mg total) by mouth 2 (two) times daily.    Dispense:  14 tablet    Refill:  0    EKG/XRAY:   Primary read interpreted by [facility]. Cleta Gonzalez at [facility].   ASSESSMENT/PLAN: Patient developing a right-sided pyelonephritis. She was given a gram of Rocephin and started on Cipro twice a day. Previous culture was sensitive to Cipro. Will recheck first thing in the morning and repeat white count consider repeating her Rocephin shot.I personally performed the services described in this documentation, which was scribed in my presence. The recorded information has been reviewed and is accurate.    Gross sideeffects, risk and benefits, and  alternatives of medications d/w patient. Patient is aware that all medications have potential sideeffects and we are unable to predict every sideeffect or drug-drug interaction that may occur.  Danielle Chris MD 01/18/2016 1:24 PM

## 2016-01-19 ENCOUNTER — Ambulatory Visit (INDEPENDENT_AMBULATORY_CARE_PROVIDER_SITE_OTHER): Payer: No Typology Code available for payment source | Admitting: Emergency Medicine

## 2016-01-19 VITALS — BP 120/74 | HR 96 | Temp 98.7°F | Resp 17 | Ht 66.0 in | Wt 140.0 lb

## 2016-01-19 DIAGNOSIS — N10 Acute tubulo-interstitial nephritis: Secondary | ICD-10-CM | POA: Diagnosis not present

## 2016-01-19 DIAGNOSIS — N926 Irregular menstruation, unspecified: Secondary | ICD-10-CM | POA: Diagnosis not present

## 2016-01-19 DIAGNOSIS — N12 Tubulo-interstitial nephritis, not specified as acute or chronic: Secondary | ICD-10-CM

## 2016-01-19 HISTORY — DX: Tubulo-interstitial nephritis, not specified as acute or chronic: N12

## 2016-01-19 LAB — POCT CBC
GRANULOCYTE PERCENT: 79.8 % (ref 37–80)
HCT, POC: 35 % — AB (ref 37.7–47.9)
Hemoglobin: 12.5 g/dL (ref 12.2–16.2)
Lymph, poc: 1.6 (ref 0.6–3.4)
MCH, POC: 32.5 pg — AB (ref 27–31.2)
MCHC: 35.9 g/dL — AB (ref 31.8–35.4)
MCV: 90.6 fL (ref 80–97)
MID (cbc): 1.1 — AB (ref 0–0.9)
MPV: 6.8 fL (ref 0–99.8)
PLATELET COUNT, POC: 184 10*3/uL (ref 142–424)
POC Granulocyte: 11 — AB (ref 2–6.9)
POC LYMPH %: 11.9 % (ref 10–50)
POC MID %: 8.3 %M (ref 0–12)
RBC: 3.86 M/uL — AB (ref 4.04–5.48)
RDW, POC: 12 %
WBC: 13.8 10*3/uL — AB (ref 4.6–10.2)

## 2016-01-19 LAB — GC/CHLAMYDIA PROBE AMP
CT PROBE, AMP APTIMA: NOT DETECTED
GC Probe RNA: NOT DETECTED

## 2016-01-19 MED ORDER — CEFTRIAXONE SODIUM 1 G IJ SOLR
1.0000 g | Freq: Once | INTRAMUSCULAR | Status: AC
  Administered 2016-01-19: 1 g via INTRAMUSCULAR

## 2016-01-19 NOTE — Progress Notes (Addendum)
By signing my name below, I, Stann Oresung-Kai Tsai, attest that this documentation has been prepared under the direction and in the presence of [name], MD. Electronically Signed: Stann Oresung-Kai Tsai, Scribe. 01/19/2016 , 2:09 PM .  Patient was seen in room 13 .  Chief Complaint:  Chief Complaint  Patient presents with  . Follow-up    kidney infection     HPI: Danielle Gonzalez is a 30 y.o. female [facility] on kidney infection. Patient states that she's feeling better compared to yesterday. She's been continuously drinking plenty of water. She was prescribed cipro. She's taken 3 tablets so far. She also received a shot of rocephin yesterday in office.   Past Medical History  Diagnosis Date  . Asthma   . Herpes genitalia    No past surgical history on file. Social History   Social History  . Marital Status: Single    Spouse Name: N/A  . Number of Children: N/A  . Years of Education: N/A   Social History Main Topics  . Smoking status: Current Every Day Smoker -- 0.50 packs/day    Types: Cigarettes  . Smokeless tobacco: Never Used  . Alcohol Use: Yes     Comment: 1-2 beers daily  . Drug Use: No  . Sexual Activity: Not Asked   Other Topics Concern  . None   Social History Narrative   No family history on file. No Known Allergies Prior to Admission medications   Medication Sig Start Date End Date Taking? Authorizing Provider  ciprofloxacin (CIPRO) 500 MG tablet Take 1 tablet (500 mg total) by mouth 2 (two) times daily. 01/18/16  Yes Collene GobbleSteven A Daub, MD  fexofenadine (ALLEGRA) 180 MG tablet Take 180 mg by mouth daily.   Yes Historical Provider, MD  ibuprofen (ADVIL,MOTRIN) 200 MG tablet Take 600 mg by mouth every 8 (eight) hours as needed for fever, headache, mild pain, moderate pain or cramping.   Yes Historical Provider, MD  IRON PO Take 1 tablet by mouth daily.   Yes Historical Provider, MD  Multiple Vitamin (MULTIVITAMIN WITH MINERALS) TABS tablet  Take 1 tablet by mouth daily.   Yes Historical Provider, MD     ROS:  Constitutional: negative for fever, chills, night sweats, weight changes, or fatigue  HEENT: negative for vision changes, hearing loss, congestion, rhinorrhea, ST, epistaxis, or sinus pressure Cardiovascular: negative for chest pain or palpitations Respiratory: negative for hemoptysis, wheezing, shortness of breath, or cough Abdominal: negative for abdominal pain, nausea, vomiting, diarrhea, or constipation Dermatological: negative for rash Neurologic: negative for headache, dizziness, or syncope All other systems reviewed and are otherwise negative with the exception to those above and in the HPI.  PHYSICAL EXAM: Filed Vitals:   01/19/16 1350  BP: 120/74  Pulse: 96  Temp: 98.7 F (37.1 C)  Resp: 17   Body mass index is 22.61 kg/(m^2).   General: Alert, no acute distress HEENT:  Normocephalic, atraumatic, oropharynx patent. [facility] Cardiovascular:  Regular rate and rhythm, no rubs murmurs or gallops.  No Carotid bruits, radial pulse intact. No pedal edema.  Respiratory: Clear to auscultation bilaterally.  No wheezes, rales, or rhonchi.  No cyanosis, no use of accessory musculature Abdominal: No organomegaly, No CVA tenderness, no abdominal tenderness Musculoskeletal: Gait intact. No edema, tenderness Skin: No rashes. Neurologic: Facial musculature symmetric. Psychiatric: Patient acts appropriately throughout our interaction.  Lymphatic: No cervical or submandibular lymphadenopathy Genitourinary/Anorectal: No acute findings  LABS: Results for orders  placed or performed in visit on 01/19/16  POCT CBC  Result Value Ref Range   WBC 13.8 (A) 4.6 - 10.2 K/uL   Lymph, poc 1.6 0.6 - 3.4   POC LYMPH PERCENT 11.9 10 - 50 %L   MID (cbc) 1.1 (A) 0 - 0.9   POC MID % 8.3 0 - 12 %M   POC Granulocyte 11.0 (A) 2 - 6.9   Granulocyte percent 79.8 37 - 80 %G   RBC 3.86 (A) 4.04 - 5.48 M/uL   Hemoglobin  12.5 12.2 - 16.2 g/dL   HCT, POC 16.1 (A) 09.6 - 47.9 %   MCV 90.6 80 - 97 fL   MCH, POC 32.5 (A) 27 - 31.2 pg   MCHC 35.9 (A) 31.8 - 35.4 g/dL   RDW, POC 04.5 %   Platelet Count, POC 184 142 - 424 K/uL   MPV 6.8 0 - 99.8 fL     EKG/XRAY:     ASSESSMENT/PLAN: White count is improving. She looks good. He was given another gram of Rocephin and to continue Cipro. Will schedule pelvic ultrasound and renal ultrasound for evaluation. She has had some dysfunctional bleeding with questionable ovarian cyst that is why we will get the pelvic ultrasound along with the renal ultrasound.I personally performed the services described in this documentation, which was scribed in my presence. The recorded information has been reviewed and is accurate.  Gross sideeffects, risk and benefits, and alternatives of medications d/w patient. Patient is aware that all medications have potential sideeffects and we are unable to predict every sideeffect or drug-drug interaction that may occur.  Lesle Chris MD 01/19/2016 2:06 PM

## 2016-01-19 NOTE — Patient Instructions (Addendum)
I have scheduled you to have a pelvic ultrasound and renal ultrasound for further evaluation of your pyelonephritis. They will call you with an appointment time.    IF you received an x-ray today, you will receive an invoice from [facility]. Please contact [facility] at [phone] with questions or concerns regarding your invoice.   IF you received labwork today, you will receive an invoice from [facility]. Please contact [facility] at [phone] with questions or concerns regarding your invoice.   Our billing staff will not be able to assist you with questions regarding bills from these companies.  You will be contacted with the lab results as soon as they are available. The fastest way to get your results is to activate your My Chart account. Instructions are located on the last page of this paperwork. If you have not heard from us regarding the results in 2 weeks, please contact this office.     Pyelonephritis, Adult Pyelonephritis is a kidney infection. The kidneys are the organs that filter a person's blood and move waste out of the bloodstream and into the urine. Urine passes from the kidneys, through the ureters, and into the bladder. There are two main types of pyelonephritis:  Infections that come on quickly without any warning (acute pyelonephritis).  Infections that last for a long period of time (chronic pyelonephritis). In most cases, the infection clears up with treatment and does not cause further problems. More severe infections or chronic infections can sometimes spread to the bloodstream or lead to other problems with the kidneys. CAUSES This condition is usually caused by:  Bacteria traveling from the bladder to the kidney through infected urine. The urine in the bladder can become infected with bacteria from:  Bladder infection (cystitis).  Inflammation of the prostate gland (prostatitis).  Sexual intercourse, in  females.  Bacteria traveling from the bloodstream to the kidney. RISK FACTORS This condition is more likely to develop in:  Pregnant women.  Older people.  People who have diabetes.  People who have kidney stones or bladder stones.  People who have other abnormalities of the kidney or ureter.  People who have a catheter placed in the bladder.  People who have cancer.  People who are sexually active.  Women who use spermicides.  People who have had a prior urinary tract infection. SYMPTOMS Symptoms of this condition include:  Frequent urination.  Strong or persistent urge to urinate.  Burning or stinging when urinating.  Abdominal pain.  Back pain.  Pain in the side or flank area.  Fever.  Chills.  Blood in the urine, or dark urine.  Nausea.  Vomiting. DIAGNOSIS This condition may be diagnosed based on:  Medical history and physical exam.  Urine tests.  Blood tests. You may also have imaging tests of the kidneys, such as an ultrasound or CT scan. TREATMENT Treatment for this condition may depend on the severity of the infection.  If the infection is mild and is found early, you may be treated with antibiotic medicines taken by mouth. You will need to drink fluids to remain hydrated.  If the infection is more severe, you may need to stay in the hospital and receive antibiotics given directly into a vein through an IV tube. You may also need to receive fluids through an IV tube if you are not able to remain hydrated. After your hospital stay, you may need to take oral antibiotics for a period of time. Other treatments may be required, depending  on the cause of the infection. HOME CARE INSTRUCTIONS Medicines  Take over-the-counter and prescription medicines only as told by your health care provider.  If you were prescribed an antibiotic medicine, take it as told by your health care provider. Do not stop taking the antibiotic even if you start to feel  better. General Instructions  Drink enough fluid to keep your urine clear or pale yellow.  Avoid caffeine, tea, and carbonated beverages. They tend to irritate the bladder.  Urinate often. Avoid holding in urine for long periods of time.  Urinate before and after sex.  After a bowel movement, women should cleanse from front to back. Use each tissue only once.  Keep all follow-up visits as told by your health care provider. This is important. SEEK MEDICAL CARE IF:  Your symptoms do not get better after 2 days of treatment.  Your symptoms get worse.  You have a fever. SEEK IMMEDIATE MEDICAL CARE IF:  You are unable to take your antibiotics or fluids.  You have shaking chills.  You vomit.  You have severe flank or back pain.  You have extreme weakness or fainting.   This information is not intended to replace advice given to you by your health care provider. Make sure you discuss any questions you have with your health care provider.   Document Released: 07/31/2005 Document Revised: 04/21/2015 Document Reviewed: 11/23/2014 Elsevier Interactive Patient Education [employer]

## 2016-01-20 LAB — URINE CULTURE

## 2016-01-25 ENCOUNTER — Telehealth: Payer: Self-pay

## 2016-01-25 DIAGNOSIS — N926 Irregular menstruation, unspecified: Secondary | ICD-10-CM

## 2016-01-25 NOTE — Telephone Encounter (Signed)
[facility] needs an order added for a transvaginal ultrasound in order to schedule the patient's pelvic ultrasound.  Until this order is added, the appointment cannot be made.

## 2016-01-26 NOTE — Telephone Encounter (Signed)
Done

## 2016-02-11 ENCOUNTER — Other Ambulatory Visit: Payer: No Typology Code available for payment source

## 2016-02-11 ENCOUNTER — Ambulatory Visit
Admission: RE | Admit: 2016-02-11 | Discharge: 2016-02-11 | Disposition: A | Discharge status: Home / Self Care | Payer: No Typology Code available for payment source | Source: Ambulatory Visit | Attending: Emergency Medicine | Admitting: Emergency Medicine

## 2016-02-11 DIAGNOSIS — N926 Irregular menstruation, unspecified: Secondary | ICD-10-CM

## 2016-02-11 DIAGNOSIS — N10 Acute tubulo-interstitial nephritis: Secondary | ICD-10-CM

## 2016-02-12 ENCOUNTER — Telehealth: Payer: Self-pay | Admitting: Emergency Medicine

## 2016-02-12 DIAGNOSIS — N7011 Chronic salpingitis: Secondary | ICD-10-CM

## 2016-02-12 NOTE — Telephone Encounter (Signed)
-----   Message from Collene GobbleSteven A Daub, MD sent at 02/12/2016  7:35 AM EDT ----- There is a small amount of fluid in the left fallopian  Tube . There are no other abnormality seen. There are multiple small normal follicular cysts. The kidneys themselves are normal. That she indeed me to forward this to her GYN doctor? The question of left hydrosalpinx should be followed up with her GYN doctor. The kidneys appeared normal

## 2016-02-12 NOTE — Telephone Encounter (Signed)
Left message to call back for lab results.

## 2016-02-22 NOTE — Telephone Encounter (Signed)
The patient returned our calls, I spoke with her , and informed of results. At this time she does not have a GYN Dr. , and she will like us to do a referral for her for follow up.

## 2016-02-23 NOTE — Telephone Encounter (Signed)
Referral put in.

## 2016-02-24 ENCOUNTER — Telehealth: Payer: Self-pay

## 2016-02-24 NOTE — Telephone Encounter (Signed)
Pt to call back with GYN group she would like to be referred to

## 2016-02-24 NOTE — Telephone Encounter (Signed)
-----   Message from Collene GobbleSteven A Daub, MD sent at 02/12/2016  7:35 AM EDT ----- There is a small amount of fluid in the left fallopian  Tube . There are no other abnormality seen. There are multiple small normal follicular cysts. The kidneys themselves are normal. That she indeed me to forward this to her GYN doctor? The question of left hydrosalpinx should be followed up with her GYN doctor. The kidneys appeared normal

## 2016-03-14 ENCOUNTER — Ambulatory Visit: Payer: No Typology Code available for payment source | Admitting: Gynecology

## 2016-03-14 DIAGNOSIS — Z0289 Encounter for other administrative examinations: Secondary | ICD-10-CM

## 2016-04-13 ENCOUNTER — Encounter: Payer: Self-pay | Admitting: Gynecology

## 2016-04-13 ENCOUNTER — Ambulatory Visit (INDEPENDENT_AMBULATORY_CARE_PROVIDER_SITE_OTHER): Payer: No Typology Code available for payment source | Admitting: Gynecology

## 2016-04-13 VITALS — BP 114/70 | Ht 66.0 in | Wt 140.0 lb

## 2016-04-13 DIAGNOSIS — N7011 Chronic salpingitis: Secondary | ICD-10-CM

## 2016-04-13 DIAGNOSIS — Z23 Encounter for immunization: Secondary | ICD-10-CM | POA: Diagnosis not present

## 2016-04-13 DIAGNOSIS — Z01419 Encounter for gynecological examination (general) (routine) without abnormal findings: Secondary | ICD-10-CM | POA: Diagnosis not present

## 2016-04-13 NOTE — Progress Notes (Signed)
Danielle ReaperBrianna Gonzalez 01/26/1986 [id number]   History:    30 y.o.  for annual gyn exam with no complaints today. Early this year she had a kidney infection and had had an ultrasound and was informed to follow-up with us. I reviewed the ultrasound from June 2017 she had a small left hydrosalpinx in benign bilateral ovarian follicles. She had a negative GC and Chlamydia culture in 2017. She is using condoms for contraception and inserted a steady relationship. She is now having normal menstrual cycles early this year she had gone to the emergency room can she skipped a month in the month that she did have a menstrual cycle she bled very heavily with passage of blood clots. Patient does smoke approximately 3-4 cigarettes a day.Several years ago she completed the HPV vaccine series. She would like to have the flu vaccine today. Patient reports no past history of any abnormal Pap smear.  Past medical history,surgical history, family history and social history were all reviewed and documented in the EPIC chart.  Gynecologic History Patient's last menstrual period was 03/09/2016. Contraception: condoms Last Pap: 3 years ago. Results were: normal Last mammogram:  Not indicated. Results were: Not indicated  Obstetric History OB History  Gravida Para Term Preterm AB Living  0 0 0 0 0 0  SAB TAB Ectopic Multiple Live Births  0 0 0 0 0         ROS: A ROS was performed and pertinent positives and negatives are included in the history.  GENERAL: No fevers or chills. HEENT: No change in vision, no earache, sore throat or sinus congestion. NECK: No pain or stiffness. CARDIOVASCULAR: No chest pain or pressure. No palpitations. PULMONARY: No shortness of breath, cough or wheeze. GASTROINTESTINAL: No abdominal pain, nausea, vomiting or diarrhea, melena or bright red blood per rectum. GENITOURINARY: No urinary frequency, urgency, hesitancy or dysuria. MUSCULOSKELETAL: No joint or muscle pain, no back pain, no  recent trauma. DERMATOLOGIC: No rash, no itching, no lesions. ENDOCRINE: No polyuria, polydipsia, no heat or cold intolerance. No recent change in weight. HEMATOLOGICAL: No anemia or easy bruising or bleeding. NEUROLOGIC: No headache, seizures, numbness, tingling or weakness. PSYCHIATRIC: No depression, no loss of interest in normal activity or change in sleep pattern.     Exam: chaperone present  BP 114/70   Ht 5\' 6"  (1.676 m)   Wt 140 lb (63.5 kg)   LMP 03/09/2016   BMI 22.60 kg/m   Body mass index is 22.6 kg/m.  General appearance : Well developed well nourished female. No acute distress HEENT: Eyes: no retinal hemorrhage or exudates,  Neck supple, trachea midline, no carotid bruits, no thyroidmegaly Lungs: Clear to auscultation, no rhonchi or wheezes, or rib retractions  Heart: Regular rate and rhythm, no murmurs or gallops Breast:Examined in sitting and supine position were symmetrical in appearance, no palpable masses or tenderness,  no skin retraction, no nipple inversion, no nipple discharge, no skin discoloration, no axillary or supraclavicular lymphadenopathy Abdomen: no palpable masses or tenderness, no rebound or guarding Extremities: no edema or skin discoloration or tenderness  Pelvic:  Bartholin, Urethra, Skene Glands: Within normal limits             Vagina: No gross lesions or discharge  Cervix: No gross lesions or discharge  Uterus  anteverted, normal size, shape and consistency, non-tender and mobile  Adnexa  Without masses or tenderness  Anus and perineum  normal   Rectovaginal  normal sphincter tone without palpated masses or  tenderness             Hemoccult not indicated     Assessment/Plan:  30 y.o. female for annual exam who was counseled on the detrimental effects of smoking. We discussed progesterone only forms of contraception such as progesterone only oral contraceptive pill, or Nexplanon or Depo-Provera injection. I did give her literature information  on the ParaGard T380A IUD as well as the Mirena IUD. I did discuss with her that in the near future when she does skip pregnant she needs to have an early ultrasound because she could be a risk for an ectopic pregnancy based on the hydrosalpinx that was seen several months ago on ultrasound. Also she may want to consider an HSG in the future before attempting pregnancy. Literature information on smoking was provided. Patient to receive the flu vaccine today after counseling.   Ok Edwards MD, 9:41 AM 04/13/2016

## 2016-04-13 NOTE — Patient Instructions (Addendum)
Steps to Quit Smoking  Smoking tobacco can be harmful to your health and can affect almost every organ in your body. Smoking puts you, and those around you, at risk for developing many serious chronic diseases. Quitting smoking is difficult, but it is one of the best things that you can do for your health. It is never too late to quit. WHAT ARE THE BENEFITS OF QUITTING SMOKING? When you quit smoking, you lower your risk of developing serious diseases and conditions, such as:  Lung cancer or lung disease, such as COPD.  Heart disease.  Stroke.  Heart attack.  Infertility.  Osteoporosis and bone fractures. Additionally, symptoms such as coughing, wheezing, and shortness of breath may get better when you quit. You may also find that you get sick less often because your body is stronger at fighting off colds and infections. If you are pregnant, quitting smoking can help to reduce your chances of having a baby of low birth weight. HOW DO I GET READY TO QUIT? When you decide to quit smoking, create a plan to make sure that you are successful. Before you quit:  Pick a date to quit. Set a date within the next two weeks to give you time to prepare.  Write down the reasons why you are quitting. Keep this list in places where you will see it often, such as on your bathroom mirror or in your car or wallet.  Identify the people, places, things, and activities that make you want to smoke (triggers) and avoid them. Make sure to take these actions:  Throw away all cigarettes at home, at work, and in your car.  Throw away smoking accessories, such as ashtrays and lighters.  Clean your car and make sure to empty the ashtray.  Clean your home, including curtains and carpets.  Tell your family, friends, and coworkers that you are quitting. Support from your loved ones can make quitting easier.  Talk with your health care provider about your options for quitting smoking.  Find out what treatment  options are covered by your health insurance. WHAT STRATEGIES CAN I USE TO QUIT SMOKING?  Talk with your healthcare provider about different strategies to quit smoking. Some strategies include:  Quitting smoking altogether instead of gradually lessening how much you smoke over a period of time. Research shows that quitting "cold turkey" is more successful than gradually quitting.  Attending in-person counseling to help you build problem-solving skills. You are more likely to have success in quitting if you attend several counseling sessions. Even short sessions of 10 minutes can be effective.  Finding resources and support systems that can help you to quit smoking and remain smoke-free after you quit. These resources are most helpful when you use them often. They can include:  Online chats with a counselor.  Telephone quitlines.  Printed self-help materials.  Support groups or group counseling.  Text messaging programs.  Mobile phone applications.  Taking medicines to help you quit smoking. (If you are pregnant or breastfeeding, talk with your health care provider first.) Some medicines contain nicotine and some do not. Both types of medicines help with cravings, but the medicines that include nicotine help to relieve withdrawal symptoms. Your health care provider may recommend:  Nicotine patches, gum, or lozenges.  Nicotine inhalers or sprays.  Non-nicotine medicine that is taken by mouth. Talk with your health care provider about combining strategies, such as taking medicines while you are also receiving in-person counseling. Using these two strategies together makes   you more likely to succeed in quitting than if you used either strategy on its own. If you are pregnant or breastfeeding, talk with your health care provider about finding counseling or other support strategies to quit smoking. Do not take medicine to help you quit smoking unless told to do so by your health care  provider. WHAT THINGS CAN I DO TO MAKE IT EASIER TO QUIT? Quitting smoking might feel overwhelming at first, but there is a lot that you can do to make it easier. Take these important actions:  Reach out to your family and friends and ask that they support and encourage you during this time. Call telephone quitlines, reach out to support groups, or work with a counselor for support.  Ask people who smoke to avoid smoking around you.  Avoid places that trigger you to smoke, such as bars, parties, or smoke-break areas at work.  Spend time around people who do not smoke.  Lessen stress in your life, because stress can be a smoking trigger for some people. To lessen stress, try:  Exercising regularly.  Deep-breathing exercises.  Yoga.  Meditating.  Performing a body scan. This involves closing your eyes, scanning your body from head to toe, and noticing which parts of your body are particularly tense. Purposefully relax the muscles in those areas.  Download or purchase mobile phone or tablet apps (applications) that can help you stick to your quit plan by providing reminders, tips, and encouragement. There are many free apps, such as QuitGuide from the [employer] developer for Disease Control and Prevention). You can find other support for quitting smoking (smoking cessation) through smokefree.gov and other websites. HOW WILL I FEEL WHEN I QUIT SMOKING? Within the first 24 hours of quitting smoking, you may start to feel some withdrawal symptoms. These symptoms are usually most noticeable 2-3 days after quitting, but they usually do not last beyond 2-3 weeks. Changes or symptoms that you might experience include:  Mood swings.  Restlessness, anxiety, or irritation.  Difficulty concentrating.  Dizziness.  Strong cravings for sugary foods in addition to nicotine.  Mild weight gain.  Constipation.  Nausea.  Coughing or a sore throat.  Changes in how your medicines work in your  body.  A depressed mood.  Difficulty sleeping (insomnia). After the first 2-3 weeks of quitting, you may start to notice more positive results, such as:  Improved sense of smell and taste.  Decreased coughing and sore throat.  Slower heart rate.  Lower blood pressure.  Clearer skin.  The ability to breathe more easily.  Fewer sick days. Quitting smoking is very challenging for most people. Do not get discouraged if you are not successful the first time. Some people need to make many attempts to quit before they achieve long-term success. Do your best to stick to your quit plan, and talk with your health care provider if you have any questions or concerns.   This information is not intended to replace advice given to you by your health care provider. Make sure you discuss any questions you have with your health care provider.   Document Released: 07/25/2001 Document Revised: 12/15/2014 Document Reviewed: 12/15/2014 Elsevier Interactive Patient Education 2016 Elsevier Inc. Influenza Virus Vaccine injection (Fluarix) What is this medicine? INFLUENZA VIRUS VACCINE (in floo EN zuh VAHY ruhs vak SEEN) helps to reduce the risk of getting influenza also known as the flu. This medicine may be used for other purposes; ask your health care provider or pharmacist if you have  questions. What should I tell my health care provider before I take this medicine? They need to know if you have any of these conditions: -bleeding disorder like hemophilia -fever or infection -Guillain-Barre syndrome or other neurological problems -immune system problems -infection with the human immunodeficiency virus (HIV) or AIDS -low blood platelet counts -multiple sclerosis -an unusual or allergic reaction to influenza virus vaccine, eggs, chicken proteins, latex, gentamicin, other medicines, foods, dyes or preservatives -pregnant or trying to get pregnant -breast-feeding How should I use this medicine? This  vaccine is for injection into a muscle. It is given by a health care professional. A copy of Vaccine Information Statements will be given before each vaccination. Read this sheet carefully each time. The sheet may change frequently. Talk to your pediatrician regarding the use of this medicine in children. Special care may be needed. Overdosage: If you think you have taken too much of this medicine contact a poison control center or emergency room at once. NOTE: This medicine is only for you. Do not share this medicine with others. What if I miss a dose? This does not apply. What may interact with this medicine? -chemotherapy or radiation therapy -medicines that lower your immune system like etanercept, anakinra, infliximab, and adalimumab -medicines that treat or prevent blood clots like warfarin -phenytoin -steroid medicines like prednisone or cortisone -theophylline -vaccines This list may not describe all possible interactions. Give your health care provider a list of all the medicines, herbs, non-prescription drugs, or dietary supplements you use. Also tell them if you smoke, drink alcohol, or use illegal drugs. Some items may interact with your medicine. What should I watch for while using this medicine? Report any side effects that do not go away within 3 days to your doctor or health care professional. Call your health care provider if any unusual symptoms occur within 6 weeks of receiving this vaccine. You may still catch the flu, but the illness is not usually as bad. You cannot get the flu from the vaccine. The vaccine will not protect against colds or other illnesses that may cause fever. The vaccine is needed every year. What side effects may I notice from receiving this medicine? Side effects that you should report to your doctor or health care professional as soon as possible: -allergic reactions like skin rash, itching or hives, swelling of the face, lips, or tongue Side effects  that usually do not require medical attention (report to your doctor or health care professional if they continue or are bothersome): -fever -headache -muscle aches and pains -pain, tenderness, redness, or swelling at site where injected -weak or tired This list may not describe all possible side effects. Call your doctor for medical advice about side effects. You may report side effects to FDA at 1-800-FDA-1088. Where should I keep my medicine? This vaccine is only given in a clinic, pharmacy, doctor's office, or other health care setting and will not be stored at home. NOTE: This sheet is a summary. It may not cover all possible information. If you have questions about this medicine, talk to your doctor, pharmacist, or health care provider.    2016, Elsevier/Gold Standard. (2008-02-26 09:30:40)

## 2016-04-13 NOTE — Addendum Note (Signed)
Addended by: Berna SpareASTILLO, BLANCA A on: 04/13/2016 09:54 AM   Modules accepted: Orders

## 2016-04-14 LAB — URINALYSIS W MICROSCOPIC + REFLEX CULTURE
BILIRUBIN URINE: NEGATIVE
Bacteria, UA: NONE SEEN [HPF]
CRYSTALS: NONE SEEN [HPF]
Casts: NONE SEEN [LPF]
GLUCOSE, UA: NEGATIVE
Hgb urine dipstick: NEGATIVE
KETONES UR: NEGATIVE
LEUKOCYTES UA: NEGATIVE
Nitrite: NEGATIVE
PH: 5.5 (ref 5.0–8.0)
Protein, ur: NEGATIVE
RBC / HPF: NONE SEEN RBC/HPF (ref <=2)
SPECIFIC GRAVITY, URINE: 1.022 (ref 1.001–1.035)
Yeast: NONE SEEN [HPF]

## 2016-04-15 LAB — URINE CULTURE: ORGANISM ID, BACTERIA: NO GROWTH

## 2016-04-18 LAB — PAP IG W/ RFLX HPV ASCU

## 2016-04-26 ENCOUNTER — Ambulatory Visit (INDEPENDENT_AMBULATORY_CARE_PROVIDER_SITE_OTHER): Payer: No Typology Code available for payment source | Admitting: Physician Assistant

## 2016-04-26 ENCOUNTER — Encounter: Payer: Self-pay | Admitting: Physician Assistant

## 2016-04-26 VITALS — BP 114/64 | HR 85 | Temp 98.6°F | Resp 16 | Ht 65.0 in | Wt 140.8 lb

## 2016-04-26 DIAGNOSIS — D72828 Other elevated white blood cell count: Secondary | ICD-10-CM

## 2016-04-26 DIAGNOSIS — Z87448 Personal history of other diseases of urinary system: Secondary | ICD-10-CM

## 2016-04-26 DIAGNOSIS — N3001 Acute cystitis with hematuria: Secondary | ICD-10-CM | POA: Diagnosis not present

## 2016-04-26 DIAGNOSIS — Z87440 Personal history of urinary (tract) infections: Secondary | ICD-10-CM | POA: Diagnosis not present

## 2016-04-26 DIAGNOSIS — R350 Frequency of micturition: Secondary | ICD-10-CM | POA: Diagnosis not present

## 2016-04-26 DIAGNOSIS — R300 Dysuria: Secondary | ICD-10-CM | POA: Diagnosis not present

## 2016-04-26 LAB — POCT URINALYSIS DIP (MANUAL ENTRY)
BILIRUBIN UA: NEGATIVE
Glucose, UA: NEGATIVE
Ketones, POC UA: NEGATIVE
Nitrite, UA: NEGATIVE
PH UA: 7
Protein Ur, POC: 30 — AB
SPEC GRAV UA: 1.01
Urobilinogen, UA: 0.2

## 2016-04-26 LAB — POCT CBC
GRANULOCYTE PERCENT: 80 % (ref 37–80)
HCT, POC: 36.3 % — AB (ref 37.7–47.9)
Hemoglobin: 13.4 g/dL (ref 12.2–16.2)
Lymph, poc: 1.8 (ref 0.6–3.4)
MCH: 32.9 pg — AB (ref 27–31.2)
MCHC: 36.8 g/dL — AB (ref 31.8–35.4)
MCV: 89.3 fL (ref 80–97)
MID (cbc): 0.7 (ref 0–0.9)
MPV: 7 fL (ref 0–99.8)
PLATELET COUNT, POC: 207 10*3/uL (ref 142–424)
POC Granulocyte: 9.8 — AB (ref 2–6.9)
POC LYMPH PERCENT: 14.6 %L (ref 10–50)
POC MID %: 5.4 %M (ref 0–12)
RBC: 4.06 M/uL (ref 4.04–5.48)
RDW, POC: 11.9 %
WBC: 12.3 10*3/uL — AB (ref 4.6–10.2)

## 2016-04-26 LAB — POC MICROSCOPIC URINALYSIS (UMFC): Mucus: ABSENT

## 2016-04-26 LAB — POCT URINE PREGNANCY: PREG TEST UR: NEGATIVE

## 2016-04-26 MED ORDER — CEPHALEXIN 500 MG PO CAPS: 500.0000 mg | ORAL_CAPSULE | Freq: Four times a day (QID) | ORAL | 0 refills | Status: DC

## 2016-04-26 MED ORDER — CEFTRIAXONE SODIUM 1 G IJ SOLR
1.0000 g | Freq: Once | INTRAMUSCULAR | Status: AC
  Administered 2016-04-26: 1 g via INTRAMUSCULAR

## 2016-04-26 NOTE — Patient Instructions (Signed)
     IF you received an x-ray today, you will receive an invoice from [facility]. Please contact [facility] at [phone] with questions or concerns regarding your invoice.   IF you received labwork today, you will receive an invoice from [facility]. Please contact [facility] at [phone] with questions or concerns regarding your invoice.   Our billing staff will not be able to assist you with questions regarding bills from these companies.  You will be contacted with the lab results as soon as they are available. The fastest way to get your results is to activate your My Chart account. Instructions are located on the last page of this paperwork. If you have not heard from us regarding the results in 2 weeks, please contact this office.      

## 2016-04-26 NOTE — Progress Notes (Signed)
04/26/2016 3:07 PM   DOB: Jun 15, 1986 / MRN: [id number]  SUBJECTIVE:  Danielle Gonzalez is a 30 y.o. female presenting for dysuria that started 3 days ago.  Associates frequency and urgency.  She is sexually active and uses condoms. No abnormal vaginal discharge. She has tried AZO.  She has history of two kidney infections and has been found to have normal anatomy by urology. She denies flank pain today, however complains of subjective fever.   She has No Known Allergies.   She  has a past medical history of Anemia; Asthma; Herpes genitalia; and Hydrosalpinx.    She  reports that she has been smoking Cigarettes.  She has been smoking about 0.50 packs per day. She has never used smokeless tobacco. She reports that she drinks alcohol. She reports that she does not use drugs. She  reports that she currently engages in sexual activity and has had female partners. She reports using the following method of birth control/protection: None. The patient  has no past surgical history on file.  Her family history includes Breast cancer in her maternal grandmother; Cancer in her maternal uncle; Hypertension in her father; Stroke in her maternal uncle.  Review of Systems  Constitutional: Positive for fever. Negative for chills.  Respiratory: Negative for cough.   Cardiovascular: Negative for chest pain.  Gastrointestinal: Negative for nausea.  Genitourinary: Positive for dysuria, frequency and urgency. Negative for flank pain.  Musculoskeletal: Negative for myalgias.  Skin: Negative for itching and rash.  Neurological: Negative for dizziness and headaches.    The problem list and medications were reviewed and updated by myself where necessary and exist elsewhere in the encounter.   OBJECTIVE:  BP 114/64 (BP Location: Right Arm, Patient Position: Sitting, Cuff Size: Normal)   Pulse 85   Temp 98.6 F (37 C) (Oral)   Resp 16   Ht 5\' 5"  (1.651 m)   Wt 140 lb 12.8 oz (63.9 kg)   LMP 03/09/2016   SpO2 99%    BMI 23.43 kg/m   Physical Exam  Constitutional: She is oriented to person, place, and time.  Cardiovascular: Normal rate and regular rhythm.   Pulmonary/Chest: Effort normal and breath sounds normal.  Abdominal: Soft. Bowel sounds are normal. She exhibits no distension and no mass. There is no tenderness. There is no rebound and no guarding.  Neurological: She is alert and oriented to person, place, and time.  Skin: Skin is warm and dry.  Psychiatric: She has a normal mood and affect.    Results for orders placed or performed in visit on 04/26/16 (from the past 72 hour(s))  POCT urinalysis dipstick     Status: Abnormal   Collection Time: 04/26/16  2:39 PM  Result Value Ref Range   Color, UA yellow yellow   Clarity, UA clear clear   Glucose, UA negative negative   Bilirubin, UA negative negative   Ketones, POC UA negative negative   Spec Grav, UA 1.010    Blood, UA moderate (A) negative   pH, UA 7.0    Protein Ur, POC =30 (A) negative   Urobilinogen, UA 0.2    Nitrite, UA Negative Negative   Leukocytes, UA large (3+) (A) Negative  POCT Microscopic Urinalysis ([facility])     Status: Abnormal   Collection Time: 04/26/16  2:43 PM  Result Value Ref Range   WBC,UR,HPF,POC Too numerous to count  (A) None WBC/hpf   RBC,UR,HPF,POC Few (A) None RBC/hpf   Bacteria Few (A) None, Too numerous  to count   Mucus Absent Absent   Epithelial Cells, UR Per Microscopy Moderate (A) None, Too numerous to count cells/hpf  POCT urine pregnancy     Status: None   Collection Time: 04/26/16  2:57 PM  Result Value Ref Range   Preg Test, Ur Negative Negative    No results found.  ASSESSMENT AND PLAN  Danielle Gonzalez was seen today for urinary frequency.  Diagnoses and all orders for this visit:  Burning with urination:  30 year old female with 3 days of urinary symptoms and urine strongly suggest UTI.  She has a history of pyelonephritis and has been evaluated by urology and nothing was found.  CBC is  worrisome for an early kidney infection.  I am getting her an IM Rocephin here and sending in oral Keflex QID for 10 days [employer]([facility] Guide states this is acceptable). I am avoiding Cipro given she is not taking birth control and she is sexually active.  She has been instructed to have a low threshold to return if she is not feeling better in 1 day.   Urinary frequency -     POCT Microscopic Urinalysis ([facility]) -     POCT urinalysis dipstick -     POCT urine pregnancy  History of pyelonephritis -     POCT CBC  Acute cystitis with hematuria -     Urine culture    The patient is advised to call or return to clinic if she does not see an improvement in symptoms, or to seek the care of the closest emergency department if she worsens with the above plan.   Deliah BostonMichael Clark, MHS, PA-C [facility] 04/26/2016 3:07 PM

## 2016-04-29 LAB — URINE CULTURE: Colony Count: >=100000

## 2016-07-30 IMAGING — US US RENAL
1 series · 14 of 25 positions shown · non-contrast
Comparison: None.

CLINICAL DATA: History of pyelonephritis

EXAM:
RENAL / URINARY TRACT ULTRASOUND COMPLETE

[Series 1: us renal · 0.26mm/px · 14 of 33 slices shown]
[im 1/33]
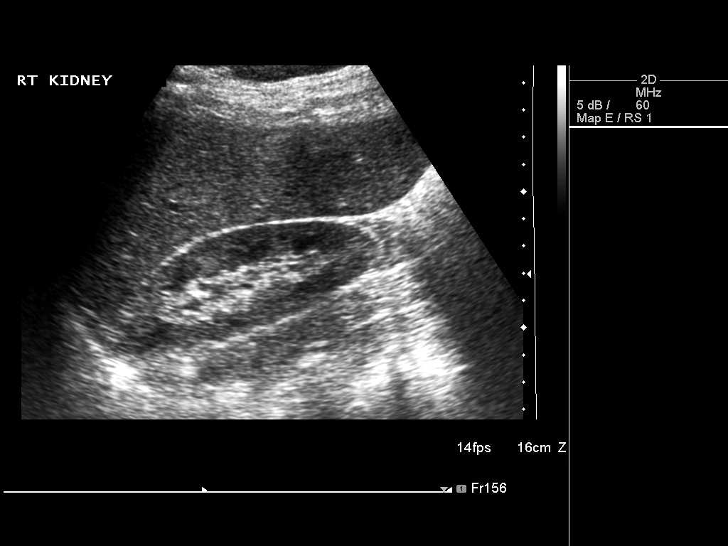
[im 3/33]
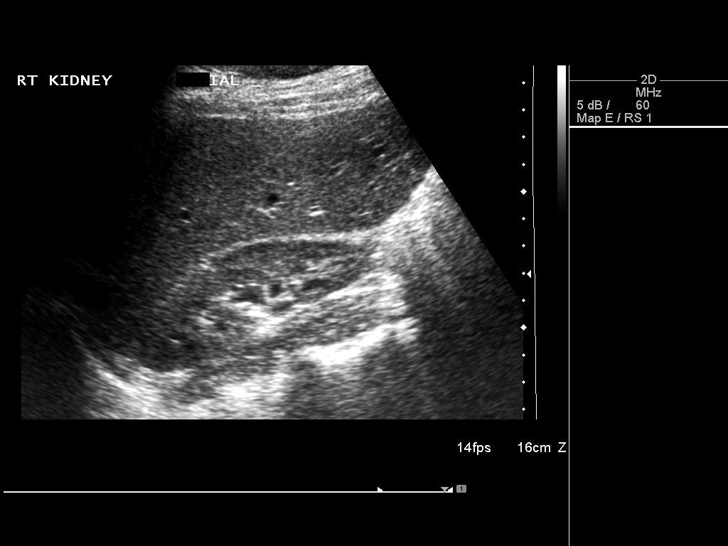
[im 6/33]
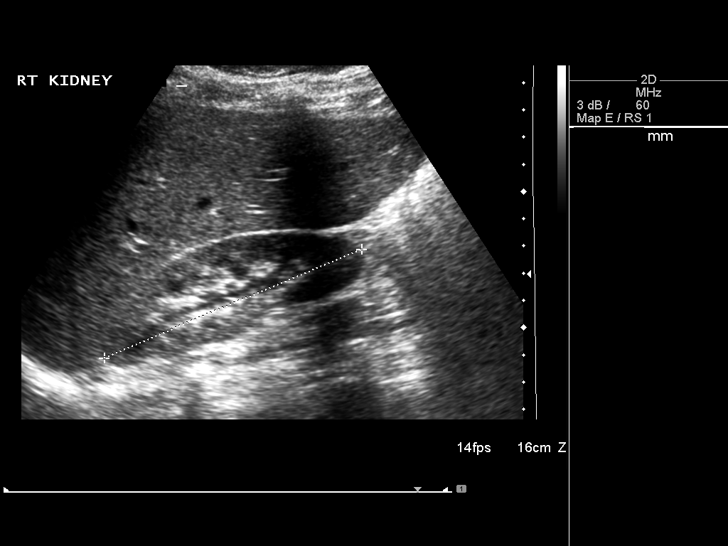
[im 9/33]
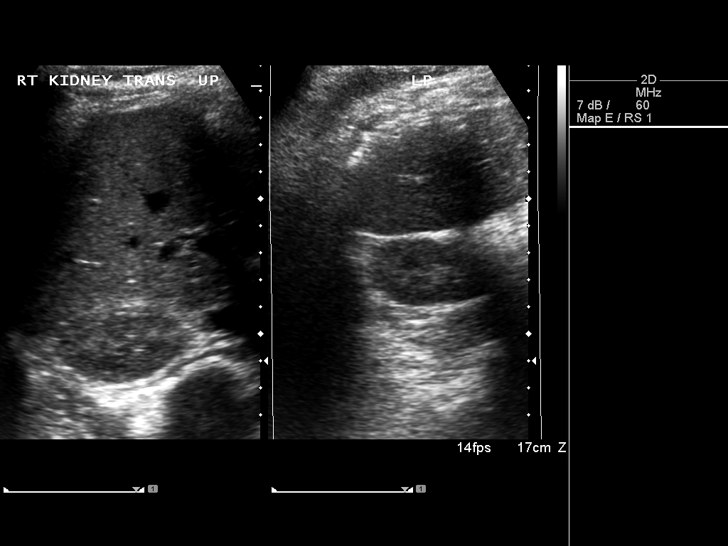
[im 11/33]
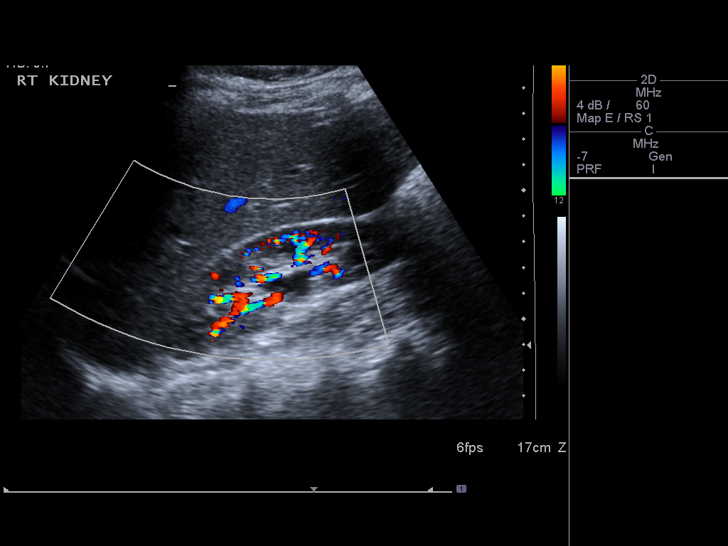
[im 13/33]
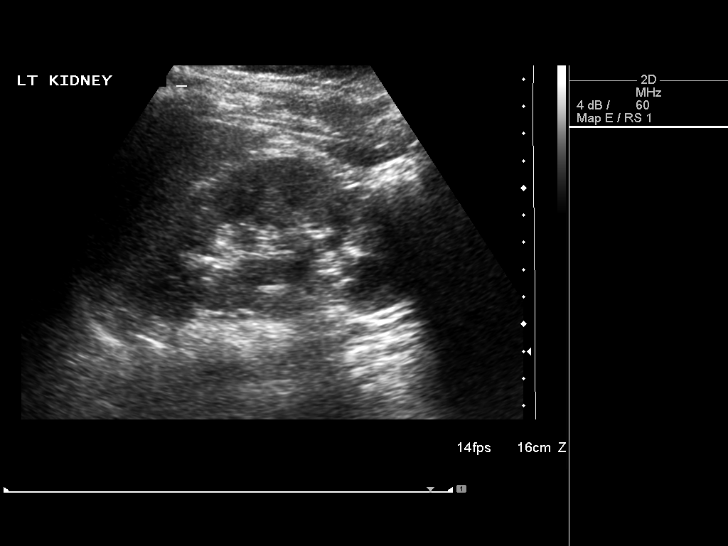
[im 15/33]
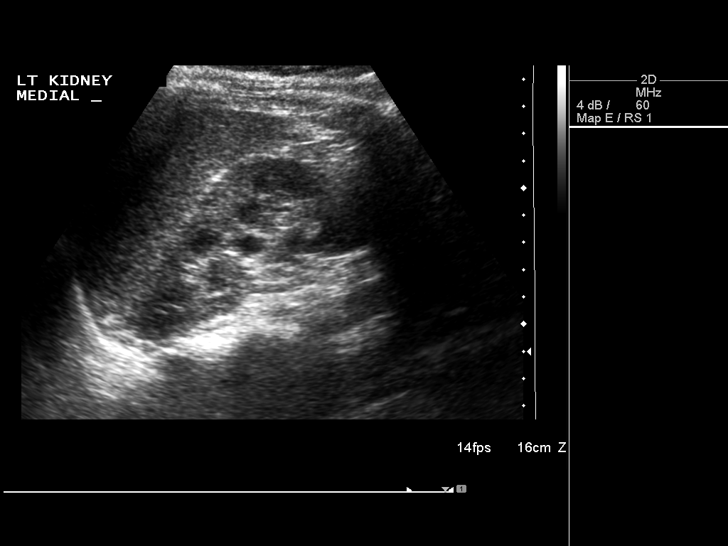
[im 18/33]
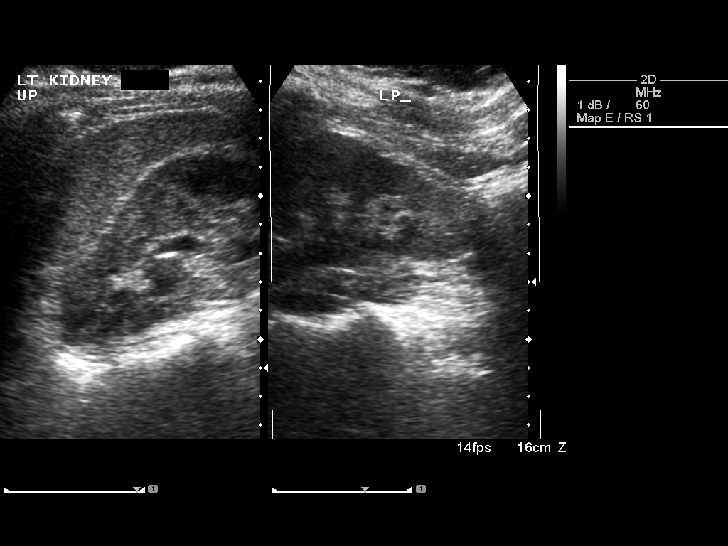
[im 21/33]
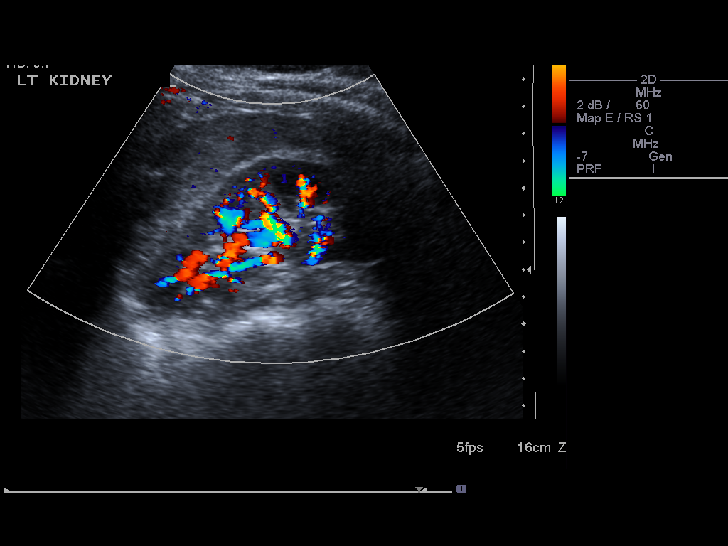
[im 22/33]
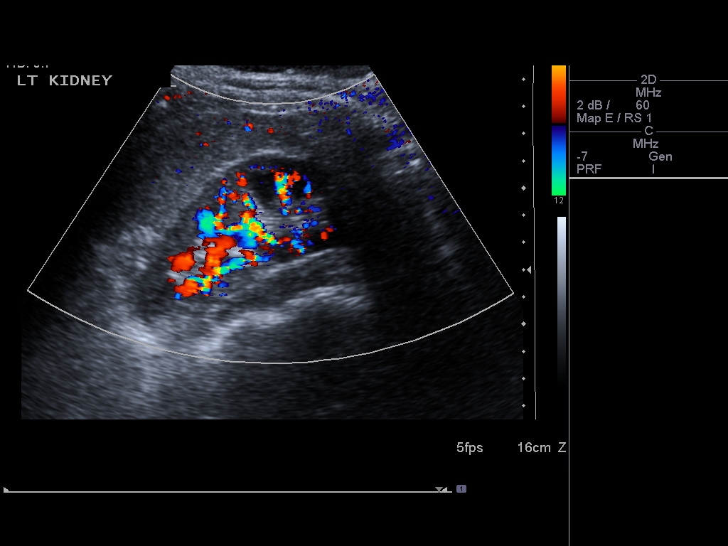
[im 25/33]
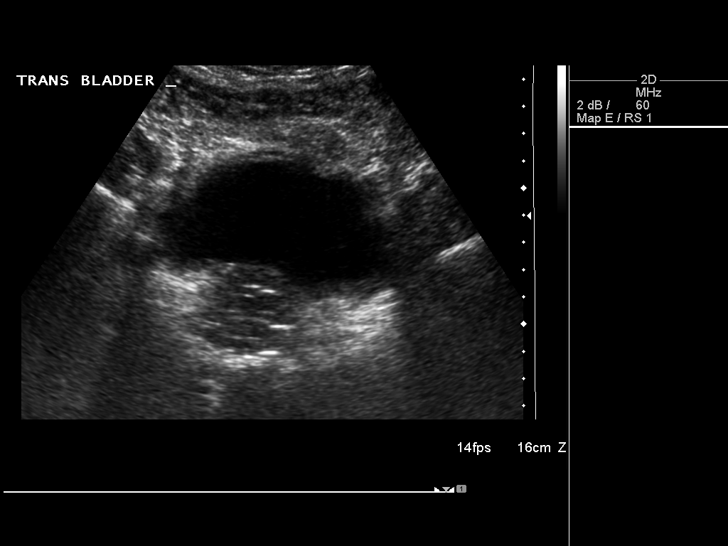
[im 27/33]
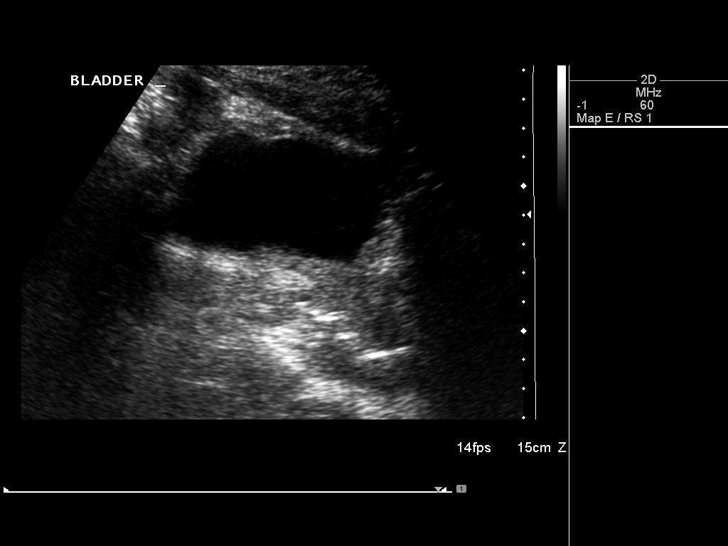
[im 30/33]
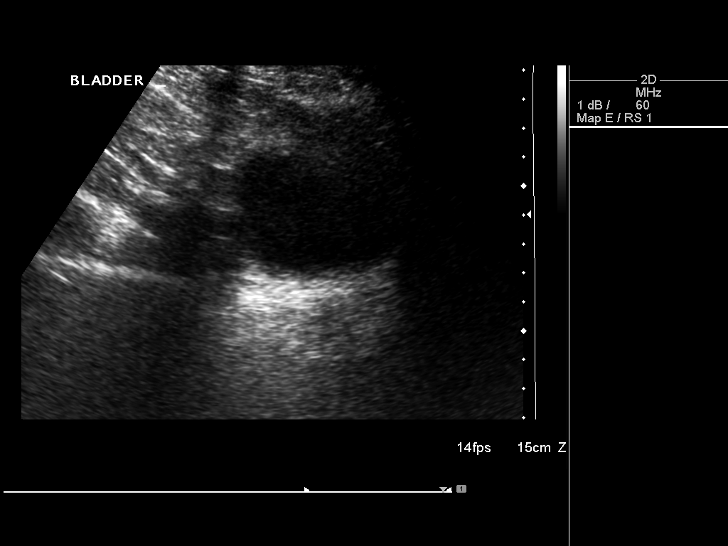
[im 33/33]
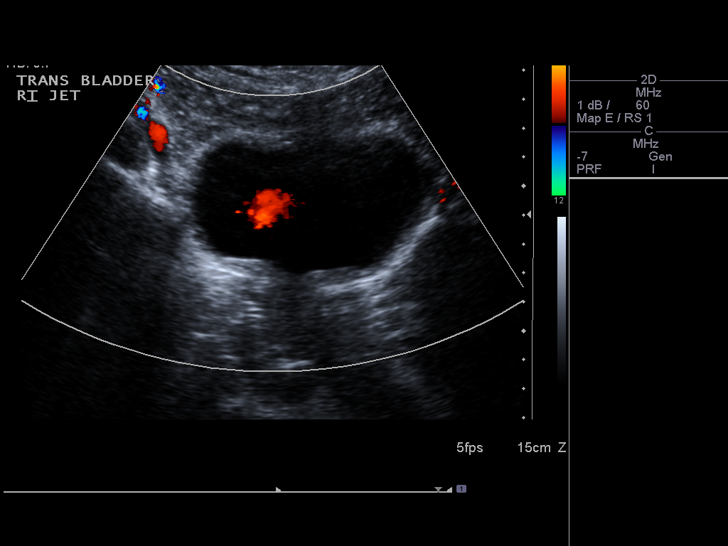

[14 of 25 positions shown; findings below may reference images not displayed]

FINDINGS: Right Kidney:

Length: 10.3 cm. Echogenicity within normal limits. No mass or
hydronephrosis visualized.

Left Kidney:

Length: 11.8 cm. Echogenicity within normal limits. No mass or
hydronephrosis visualized.

Bilateral no renal calculi.

Bladder:

Appears normal for degree of bladder distention. Bilateral ureteral
jets are visualized.
IMPRESSION: 1. No hydronephrosis. No renal calculi. Unremarkable urinary
bladder.

## 2016-07-30 IMAGING — US US RENAL
1 series · 5 of 5 positions shown · non-contrast
Comparison: None.

CLINICAL DATA: History of pyelonephritis

EXAM:
RENAL / URINARY TRACT ULTRASOUND COMPLETE

[Series 1: us renal · 0.28mm/px · 5 of 5 slices shown]
[im 1/5]
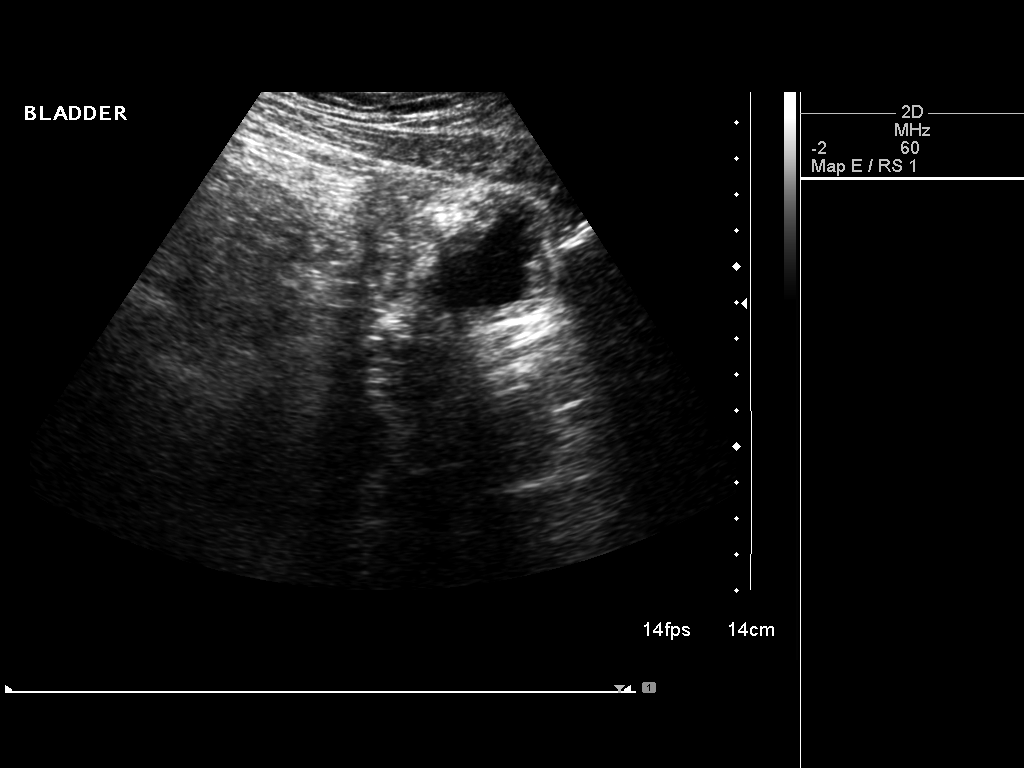
[im 2/5]
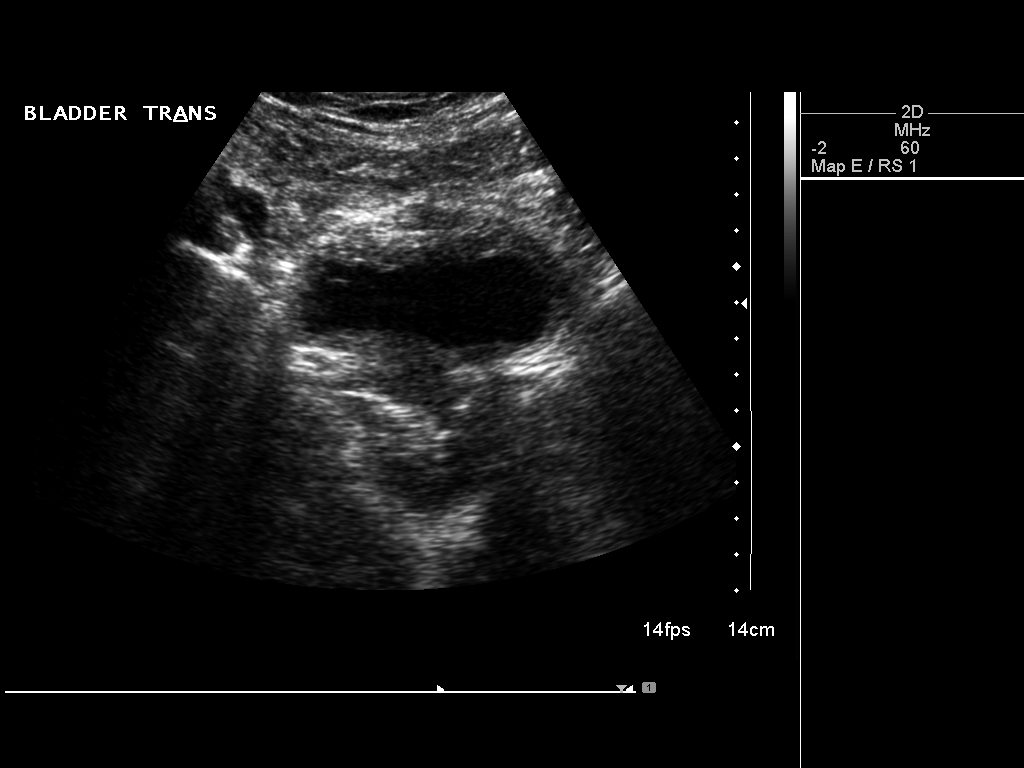
[im 3/5]
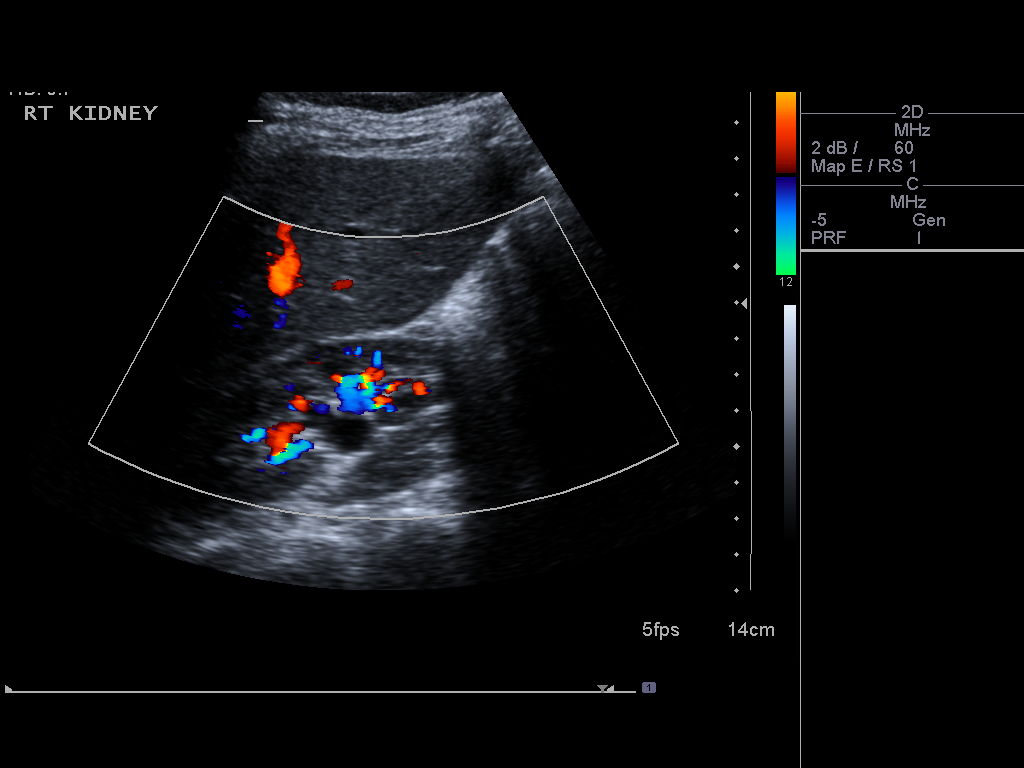
[im 4/5]
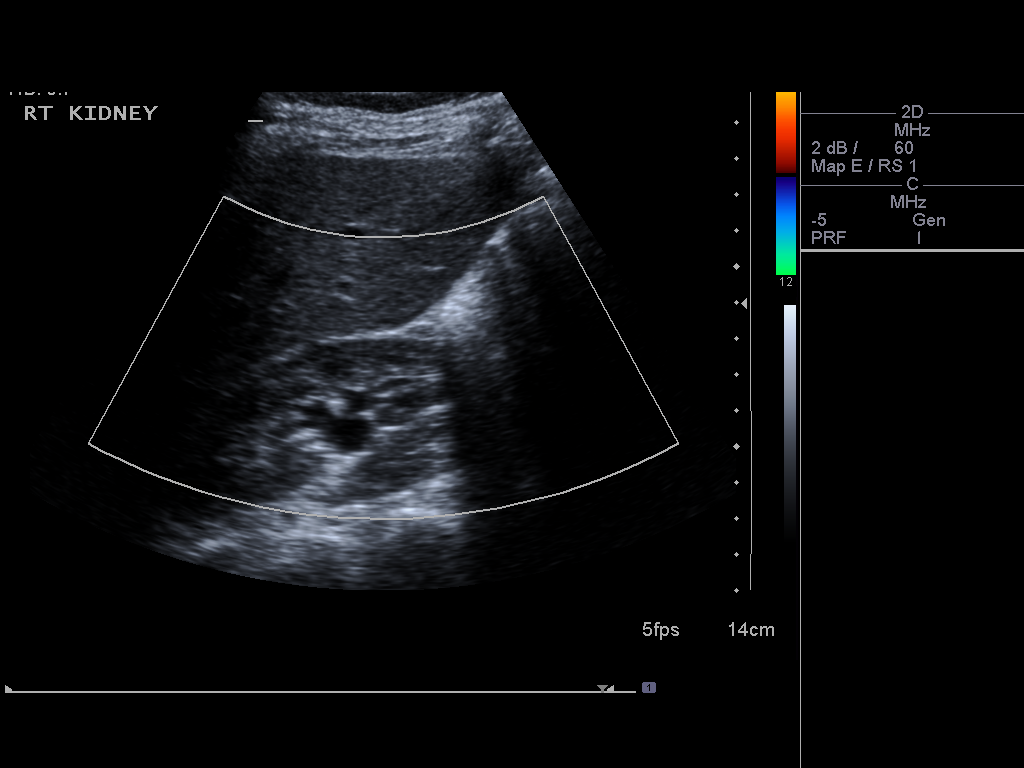
[im 5/5]
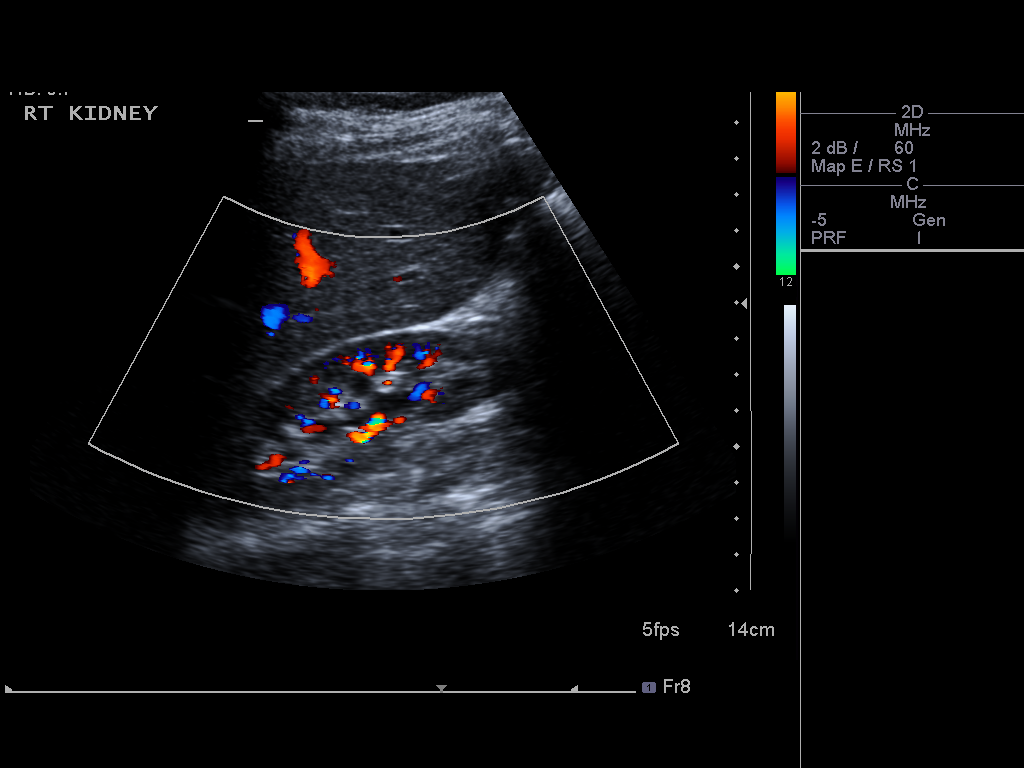

[5 of 5 positions shown; findings below may reference images not displayed]

FINDINGS: Right Kidney:

Length: 10.3 cm. Echogenicity within normal limits. No mass or
hydronephrosis visualized.

Left Kidney:

Length: 11.8 cm. Echogenicity within normal limits. No mass or
hydronephrosis visualized.

Bilateral no renal calculi.

Bladder:

Appears normal for degree of bladder distention. Bilateral ureteral
jets are visualized.
IMPRESSION: 1. No hydronephrosis. No renal calculi. Unremarkable urinary
bladder.

## 2016-12-27 ENCOUNTER — Encounter: Payer: Self-pay | Admitting: Gynecology

## 2017-09-21 ENCOUNTER — Encounter: Payer: Self-pay | Admitting: Physician Assistant

## 2017-09-21 ENCOUNTER — Ambulatory Visit: Payer: BLUE CROSS/BLUE SHIELD | Admitting: Physician Assistant

## 2019-05-05 ENCOUNTER — Encounter: Payer: Self-pay | Admitting: Gynecology

## 2021-07-05 ENCOUNTER — Ambulatory Visit
Admission: EM | Admit: 2021-07-05 | Discharge: 2021-07-05 | Disposition: A | Discharge status: Home / Self Care | Payer: BC Managed Care – PPO | Attending: Emergency Medicine | Admitting: Emergency Medicine

## 2021-07-05 DIAGNOSIS — R52 Pain, unspecified: Secondary | ICD-10-CM

## 2021-07-05 DIAGNOSIS — R0981 Nasal congestion: Secondary | ICD-10-CM | POA: Diagnosis not present

## 2021-07-05 DIAGNOSIS — J09X2 Influenza due to identified novel influenza A virus with other respiratory manifestations: Secondary | ICD-10-CM

## 2021-07-05 DIAGNOSIS — R051 Acute cough: Secondary | ICD-10-CM | POA: Diagnosis not present

## 2021-07-05 LAB — POCT INFLUENZA A/B
Influenza A, POC: POSITIVE — AB
Influenza B, POC: NEGATIVE

## 2021-07-05 MED ORDER — OSELTAMIVIR PHOSPHATE 75 MG PO CAPS: 75.0000 mg | ORAL_CAPSULE | Freq: Two times a day (BID) | ORAL | 0 refills | Status: DC

## 2021-07-05 MED ORDER — PREDNISONE 10 MG (21) PO TBPK: ORAL_TABLET | Freq: Every day | ORAL | 0 refills | Status: DC

## 2021-07-05 MED ORDER — ALBUTEROL SULFATE HFA 108 (90 BASE) MCG/ACT IN AERS: 1.0000 | INHALATION_SPRAY | Freq: Four times a day (QID) | RESPIRATORY_TRACT | 0 refills | Status: AC | PRN

## 2021-07-05 NOTE — ED Triage Notes (Signed)
Pt reports having fever, congestion and headache. She reports taking at home Covid test that was negative.  Started: last night

## 2021-07-05 NOTE — ED Provider Notes (Signed)
[facility]    CSN: [id number] Arrival date & time: 07/05/21  1314      History   Chief Complaint No chief complaint on file.   HPI Danielle Gonzalez is a 35 y.o. female.   Cough congestion, body aches, not feeling well since last night. States that she has been taking otc meds with no relief. She just feels bad.    Past Medical History:  Diagnosis Date   Anemia    Asthma    Herpes genitalia    Hydrosalpinx    Pyelonephritis 01/19/2016    Patient Active Problem List   Diagnosis Date Noted   Hydrosalpinx 04/13/2016    History reviewed. No pertinent surgical history.  OB History     Gravida  0   Para  0   Term  0   Preterm  0   AB  0   Living  0      SAB  0   IAB  0   Ectopic  0   Multiple  0   Live Births  0            Home Medications    Prior to Admission medications   Medication Sig Start Date End Date Taking? Authorizing Provider  albuterol (VENTOLIN HFA) 108 (90 Base) MCG/ACT inhaler Inhale 1-2 puffs into the lungs every 6 (six) hours as needed for wheezing or shortness of breath. 07/05/21  Yes Coralyn Mark, NP  oseltamivir (TAMIFLU) 75 MG capsule Take 1 capsule (75 mg total) by mouth every 12 (twelve) hours. 07/05/21  Yes Coralyn Mark, NP  predniSONE (STERAPRED UNI-PAK 21 TAB) 10 MG (21) TBPK tablet Take by mouth daily. Take 6 tabs by mouth daily  for 2 days, then 5 tabs for 2 days, then 4 tabs for 2 days, then 3 tabs for 2 days, 2 tabs for 2 days, then 1 tab by mouth daily for 2 days 07/05/21  Yes Coralyn Mark, NP  cephALEXin (KEFLEX) 500 MG capsule Take 1 capsule (500 mg total) by mouth 4 (four) times daily. 04/26/16   Ofilia Neas, PA-C  fexofenadine (ALLEGRA) 180 MG tablet Take 180 mg by mouth daily.    [provider]  ibuprofen (ADVIL,MOTRIN) 200 MG tablet Take 600 mg by mouth every 8 (eight) hours as needed for fever, headache, mild pain, moderate pain or cramping.    [provider]  IRON PO Take 1 tablet by mouth daily.    [provider]  Multiple Vitamin (MULTIVITAMIN WITH MINERALS) TABS tablet Take 1 tablet by mouth daily.    [provider]    Family History Family History  Problem Relation Age of Onset   Hypertension Father    Stroke Maternal Uncle    Cancer Maternal Uncle        LIVER/LUNG   Breast cancer Maternal Grandmother     Social History Social History   Tobacco Use   Smoking status: Every Day    Packs/day: 0.50    Types: Cigarettes   Smokeless tobacco: Never  Substance Use Topics   Alcohol use: Yes    Comment: 1-2 beers daily   Drug use: No     Allergies   Patient has no known allergies.   Review of Systems Review of Systems  Constitutional:  Positive for chills and fatigue.  HENT:  Positive for congestion, postnasal drip, rhinorrhea, sinus pressure, sinus pain and sneezing.   Eyes: Negative.   Respiratory:  Positive  for cough. Negative for shortness of breath.   Cardiovascular: Negative.   Gastrointestinal: Negative.   Genitourinary: Negative.   Neurological: Negative.     Physical Exam Triage Vital Signs ED Triage Vitals  Enc Vitals Group     BP 07/05/21 1430 107/90     Pulse Rate 07/05/21 1430 90     Resp 07/05/21 1430 18     Temp 07/05/21 1430 99.1 F (37.3 C)     Temp Source 07/05/21 1430 Oral     SpO2 07/05/21 1430 96 %     Weight --      Height --      Head Circumference --      Peak Flow --      Pain Score 07/05/21 1433 9     Pain Loc --      Pain Edu? --      Excl. in GC? --    No data found.  Updated Vital Signs BP 107/90 (BP Location: Left Arm)   Pulse 90   Temp 99.1 F (37.3 C) (Oral)   Resp 18   LMP 06/27/2021   SpO2 96%   Visual Acuity Right Eye Distance:   Left Eye Distance:   Bilateral Distance:    Right Eye Near:   Left Eye Near:    Bilateral Near:     Physical Exam Constitutional:      Appearance: She is ill-appearing.  HENT:     Right Ear: Tympanic  membrane normal.     Left Ear: Tympanic membrane normal.     Nose: Congestion and rhinorrhea present.     Mouth/Throat:     Pharynx: Posterior oropharyngeal erythema present.  Pulmonary:     Effort: Pulmonary effort is normal.  Abdominal:     General: Abdomen is flat.  Musculoskeletal:        General: Normal range of motion.  Skin:    General: Skin is warm.  Neurological:     General: No focal deficit present.     Mental Status: She is alert.     UC Treatments / Results  Labs (all labs ordered are listed, but only abnormal results are displayed) Labs Reviewed  POCT INFLUENZA A/B - Abnormal; Notable for the following components:      Result Value   Influenza A, POC Positive (*)    All other components within normal limits    EKG   Radiology No results found.  Procedures Procedures (including critical care time)  Medications Ordered in UC Medications - No data to display  Initial Impression / Assessment and Plan / UC Course  I have reviewed the triage vital signs and the nursing notes.  Pertinent labs & imaging results that were available during my care of the patient were reviewed by me and considered in my medical decision making (see chart for details).     Cont to take otc cough and cold meds This seems more viral in nature  Take motrin or tylenol as needed for fever or pain  If symptoms become worse go to er  Use a humidifier to help with the cough  Final Clinical Impressions(s) / UC Diagnoses   Final diagnoses:  Acute cough  Nasal congestion  Body aches  Influenza due to identified novel influenza A virus with other respiratory manifestations     Discharge Instructions      Cont to take otc cough and cold meds This seems more viral in nature  Take motrin or tylenol as needed  for fever or pain  If symptoms become worse go to er  Use a humidifier to help with the cough     ED Prescriptions     Medication Sig Dispense Auth. Provider    oseltamivir (TAMIFLU) 75 MG capsule Take 1 capsule (75 mg total) by mouth every 12 (twelve) hours. 10 capsule Maple Mirza L, NP   predniSONE (STERAPRED UNI-PAK 21 TAB) 10 MG (21) TBPK tablet Take by mouth daily. Take 6 tabs by mouth daily  for 2 days, then 5 tabs for 2 days, then 4 tabs for 2 days, then 3 tabs for 2 days, 2 tabs for 2 days, then 1 tab by mouth daily for 2 days 42 tablet Maple Mirza L, NP   albuterol (VENTOLIN HFA) 108 (90 Base) MCG/ACT inhaler Inhale 1-2 puffs into the lungs every 6 (six) hours as needed for wheezing or shortness of breath. 90 g Coralyn Mark, NP      PDMP not reviewed this encounter.   Coralyn Mark, NP 07/05/21 [phone]

## 2021-07-05 NOTE — Discharge Instructions (Addendum)
Cont to take otc cough and cold meds This seems more viral in nature  Take motrin or tylenol as needed for fever or pain  If symptoms become worse go to er  Use a humidifier to help with the cough  

## 2021-11-18 ENCOUNTER — Ambulatory Visit: Payer: Self-pay

## 2022-06-12 ENCOUNTER — Ambulatory Visit: Payer: No Typology Code available for payment source

## 2023-02-21 ENCOUNTER — Ambulatory Visit: Payer: No Typology Code available for payment source

## 2023-02-22 ENCOUNTER — Ambulatory Visit
Admission: RE | Admit: 2023-02-22 | Discharge: 2023-02-22 | Disposition: A | Discharge status: Home / Self Care | Payer: BC Managed Care – PPO | Source: Ambulatory Visit | Attending: Family Medicine | Admitting: Family Medicine

## 2023-02-22 VITALS — BP 99/60 | HR 93 | Temp 98.6°F | Resp 16

## 2023-02-22 DIAGNOSIS — L03115 Cellulitis of right lower limb: Secondary | ICD-10-CM

## 2023-02-22 MED ORDER — DOXYCYCLINE HYCLATE 100 MG PO CAPS: 100.0000 mg | ORAL_CAPSULE | Freq: Two times a day (BID) | ORAL | 0 refills | Status: AC

## 2023-02-22 NOTE — Discharge Instructions (Addendum)
Take doxycycline 100 mg --1 capsule 2 times daily for 7 days  Please follow-up with primary care if the skin bump is not resolving with treatment.  You could also call Dr. Onalee Hua, a dermatologist.

## 2023-02-22 NOTE — ED Triage Notes (Signed)
Patient presents to UC for insect bite to right lower leg noted it 9 days ago. Continues to spread. Treating with castor oil.

## 2023-02-22 NOTE — ED Provider Notes (Signed)
[facility]    CSN: [id number] Arrival date & time: 02/22/23  1158      History   Chief Complaint Chief Complaint  Patient presents with   Insect Bite    Entered by patient    HPI Danielle Gonzalez is a 37 y.o. female.   HPI Here for a bump on her right lower leg.  It started about 10 days ago and looks like a pimple.  It has never drained anything  and does not really itch nor hurt.  Now in the last couple of days she has a halo of a ring around it.  No noted insect or tick bite.  No allergies to medications  Last menstrual cycle began June 20   Past Medical History:  Diagnosis Date   Anemia    Asthma    Herpes genitalia    Hydrosalpinx    Pyelonephritis 01/19/2016    Patient Active Problem List   Diagnosis Date Noted   Hydrosalpinx 04/13/2016    History reviewed. No pertinent surgical history.  OB History     Gravida  0   Para  0   Term  0   Preterm  0   AB  0   Living  0      SAB  0   IAB  0   Ectopic  0   Multiple  0   Live Births  0            Home Medications    Prior to Admission medications   Medication Sig Start Date End Date Taking? Authorizing Provider  doxycycline (VIBRAMYCIN) 100 MG capsule Take 1 capsule (100 mg total) by mouth 2 (two) times daily for 7 days. 02/22/23 03/01/23 Yes Zenia Resides, MD  valACYclovir HCl (VALTREX PO) Take by mouth.   Yes [provider]  albuterol (VENTOLIN HFA) 108 (90 Base) MCG/ACT inhaler Inhale 1-2 puffs into the lungs every 6 (six) hours as needed for wheezing or shortness of breath. 07/05/21   Coralyn Mark, NP  fexofenadine (ALLEGRA) 180 MG tablet Take 180 mg by mouth daily.    [provider]  ibuprofen (ADVIL,MOTRIN) 200 MG tablet Take 600 mg by mouth every 8 (eight) hours as needed for fever, headache, mild pain, moderate pain or cramping.    [provider]  IRON PO Take 1 tablet by mouth daily.    [provider]  Multiple  Vitamin (MULTIVITAMIN WITH MINERALS) TABS tablet Take 1 tablet by mouth daily.    [provider]    Family History Family History  Problem Relation Age of Onset   Hypertension Father    Stroke Maternal Uncle    Cancer Maternal Uncle        LIVER/LUNG   Breast cancer Maternal Grandmother     Social History Social History   Tobacco Use   Smoking status: Every Day    Current packs/day: 0.50    Types: Cigarettes   Smokeless tobacco: Never  Substance Use Topics   Alcohol use: Yes    Comment: 1-2 beers daily   Drug use: No     Allergies   Patient has no known allergies.   Review of Systems Review of Systems   Physical Exam Triage Vital Signs ED Triage Vitals [02/22/23 1210]  Encounter Vitals Group     BP 99/60     Systolic BP Percentile      Diastolic BP Percentile      Pulse Rate 93  Resp 16     Temp 98.6 F (37 C)     Temp Source Oral     SpO2 98 %     Weight      Height      Head Circumference      Peak Flow      Pain Score 0     Pain Loc      Pain Education      Exclude from Growth Chart    No data found.  Updated Vital Signs BP 99/60 (BP Location: Left Arm)   Pulse 93   Temp 98.6 F (37 C) (Oral)   Resp 16   LMP 02/01/2023 (Approximate)   SpO2 98%   Visual Acuity Right Eye Distance:   Left Eye Distance:   Bilateral Distance:    Right Eye Near:   Left Eye Near:    Bilateral Near:     Physical Exam Vitals reviewed.  Constitutional:      General: She is not in acute distress.    Appearance: She is not toxic-appearing.  Skin:    Coloration: Skin is not jaundiced or pale.     Comments: On her medial right lower leg there is a little erythematous bump about 2 mm in diameter.  Posterior to that there is a partial ring of mild erythema that is about 3 cm in length and about 5 mm wide.  It is not densely indurated.  It appears spotty.  There is no fluctuance.  Neurological:     Mental Status: She is alert and oriented to  person, place, and time.  Psychiatric:        Behavior: Behavior normal.      UC Treatments / Results  Labs (all labs ordered are listed, but only abnormal results are displayed) Labs Reviewed - No data to display  EKG   Radiology No results found.  Procedures Procedures (including critical care time)  Medications Ordered in UC Medications - No data to display  Initial Impression / Assessment and Plan / UC Course  I have reviewed the triage vital signs and the nursing notes.  Pertinent labs & imaging results that were available during my care of the patient were reviewed by me and considered in my medical decision making (see chart for details).        I am going to treat with doxycycline for possible cellulitis and possible erythema migrans.  I discussed with the patient that is not completely typical for erythema migrans/target rash.   Final Clinical Impressions(s) / UC Diagnoses   Final diagnoses:  Cellulitis of leg, right     Discharge Instructions      Take doxycycline 100 mg --1 capsule 2 times daily for 7 days  Please follow-up with primary care if the skin bump is not resolving with treatment.  You could also call Dr. Onalee Hua, a dermatologist.     ED Prescriptions     Medication Sig Dispense Auth. Provider   doxycycline (VIBRAMYCIN) 100 MG capsule Take 1 capsule (100 mg total) by mouth 2 (two) times daily for 7 days. 14 capsule Marlinda Mike, Janace Aris, MD      PDMP not reviewed this encounter.   Zenia Resides, MD 02/22/23 [phone]

## 2024-04-17 ENCOUNTER — Emergency Department (HOSPITAL_COMMUNITY)

## 2024-04-17 ENCOUNTER — Other Ambulatory Visit: Payer: Self-pay

## 2024-04-17 ENCOUNTER — Emergency Department (HOSPITAL_COMMUNITY)
Admission: EM | Admit: 2024-04-17 | Discharge: 2024-04-17 | Disposition: A | Discharge status: Home / Self Care | Attending: Emergency Medicine | Admitting: Emergency Medicine

## 2024-04-17 ENCOUNTER — Encounter (HOSPITAL_COMMUNITY): Payer: Self-pay

## 2024-04-17 DIAGNOSIS — R102 Pelvic and perineal pain: Secondary | ICD-10-CM | POA: Insufficient documentation

## 2024-04-17 DIAGNOSIS — D72829 Elevated white blood cell count, unspecified: Secondary | ICD-10-CM | POA: Insufficient documentation

## 2024-04-17 DIAGNOSIS — R110 Nausea: Secondary | ICD-10-CM | POA: Diagnosis not present

## 2024-04-17 DIAGNOSIS — R1031 Right lower quadrant pain: Secondary | ICD-10-CM | POA: Diagnosis present

## 2024-04-17 LAB — I-STAT CHEM 8, ED
BUN: 12 mg/dL (ref 6–20)
Calcium, Ion: 1.12 mmol/L — ABNORMAL LOW (ref 1.15–1.40)
Chloride: 104 mmol/L (ref 98–111)
Creatinine, Ser: 1 mg/dL (ref 0.44–1.00)
Glucose, Bld: 106 mg/dL — ABNORMAL HIGH (ref 70–99)
HCT: 38 % (ref 36.0–46.0)
Hemoglobin: 12.9 g/dL (ref 12.0–15.0)
Potassium: 4.3 mmol/L (ref 3.5–5.1)
Sodium: 137 mmol/L (ref 135–145)
TCO2: 19 mmol/L — ABNORMAL LOW (ref 22–32)

## 2024-04-17 LAB — CBC WITH DIFFERENTIAL/PLATELET
Abs Immature Granulocytes: 0.1 K/uL — ABNORMAL HIGH (ref 0.00–0.07)
Basophils Absolute: 0.1 K/uL (ref 0.0–0.1)
Basophils Relative: 1 %
Eosinophils Absolute: 0 K/uL (ref 0.0–0.5)
Eosinophils Relative: 0 %
HCT: 38 % (ref 36.0–46.0)
Hemoglobin: 13.2 g/dL (ref 12.0–15.0)
Immature Granulocytes: 1 %
Lymphocytes Relative: 13 %
Lymphs Abs: 1.6 K/uL (ref 0.7–4.0)
MCH: 31.4 pg (ref 26.0–34.0)
MCHC: 34.7 g/dL (ref 30.0–36.0)
MCV: 90.3 fL (ref 80.0–100.0)
Monocytes Absolute: 0.6 K/uL (ref 0.1–1.0)
Monocytes Relative: 5 %
Neutro Abs: 9.7 K/uL — ABNORMAL HIGH (ref 1.7–7.7)
Neutrophils Relative %: 80 %
Platelets: 315 K/uL (ref 150–400)
RBC: 4.21 MIL/uL (ref 3.87–5.11)
RDW: 11.9 % (ref 11.5–15.5)
WBC: 12.1 K/uL — ABNORMAL HIGH (ref 4.0–10.5)
nRBC: 0 % (ref 0.0–0.2)

## 2024-04-17 LAB — COMPREHENSIVE METABOLIC PANEL WITH GFR
ALT: 22 U/L (ref 0–44)
AST: 30 U/L (ref 15–41)
Albumin: 4.5 g/dL (ref 3.5–5.0)
Alkaline Phosphatase: 33 U/L — ABNORMAL LOW (ref 38–126)
Anion gap: 15 (ref 5–15)
BUN: 11 mg/dL (ref 6–20)
CO2: 19 mmol/L — ABNORMAL LOW (ref 22–32)
Calcium: 9.3 mg/dL (ref 8.9–10.3)
Chloride: 104 mmol/L (ref 98–111)
Creatinine, Ser: 0.94 mg/dL (ref 0.44–1.00)
GFR, Estimated: >60 mL/min (ref >60)
Glucose, Bld: 103 mg/dL — ABNORMAL HIGH (ref 70–99)
Potassium: 4.3 mmol/L (ref 3.5–5.1)
Sodium: 138 mmol/L (ref 135–145)
Total Bilirubin: 0.7 mg/dL (ref 0.0–1.2)
Total Protein: 7 g/dL (ref 6.5–8.1)

## 2024-04-17 LAB — URINALYSIS, ROUTINE W REFLEX MICROSCOPIC
Bacteria, UA: NONE SEEN
Bilirubin Urine: NEGATIVE
Glucose, UA: NEGATIVE mg/dL
Hgb urine dipstick: NEGATIVE
Ketones, ur: NEGATIVE mg/dL
Leukocytes,Ua: NEGATIVE
Nitrite: NEGATIVE
Protein, ur: 30 mg/dL — AB
Specific Gravity, Urine: 1.03 (ref 1.005–1.030)
pH: 8 (ref 5.0–8.0)

## 2024-04-17 LAB — HCG, QUANTITATIVE, PREGNANCY: hCG, Beta Chain, Quant, S: <1 m[IU]/mL (ref <5)

## 2024-04-17 LAB — LIPASE, BLOOD: Lipase: 34 U/L (ref 11–51)

## 2024-04-17 MED ORDER — IBUPROFEN 800 MG PO TABS
800.0000 mg | ORAL_TABLET | Freq: Three times a day (TID) | ORAL | 0 refills | Status: AC | PRN
Start: 2024-04-17 — End: ?

## 2024-04-17 MED ORDER — HYDROMORPHONE HCL 1 MG/ML IJ SOLN
1.0000 mg | Freq: Once | INTRAMUSCULAR | Status: AC
  Administered 2024-04-17: 1 mg via INTRAVENOUS
  Filled 2024-04-17: qty 1

## 2024-04-17 MED ORDER — OXYCODONE-ACETAMINOPHEN 5-325 MG PO TABS
1.0000 | ORAL_TABLET | Freq: Four times a day (QID) | ORAL | 0 refills | Status: AC | PRN
Start: 2024-04-17 — End: ?

## 2024-04-17 MED ORDER — KETOROLAC TROMETHAMINE 30 MG/ML IJ SOLN
30.0000 mg | Freq: Once | INTRAMUSCULAR | Status: AC
  Administered 2024-04-17: 30 mg via INTRAVENOUS
  Filled 2024-04-17: qty 1

## 2024-04-17 MED ORDER — ONDANSETRON HCL 4 MG/2ML IJ SOLN
4.0000 mg | Freq: Once | INTRAMUSCULAR | Status: AC
  Administered 2024-04-17: 4 mg via INTRAVENOUS
  Filled 2024-04-17: qty 2

## 2024-04-17 MED ORDER — ONDANSETRON 4 MG PO TBDP
4.0000 mg | ORAL_TABLET | Freq: Once | ORAL | Status: AC
  Administered 2024-04-17: 4 mg via ORAL
  Filled 2024-04-17: qty 1

## 2024-04-17 MED ORDER — IOHEXOL 350 MG/ML SOLN
75.0000 mL | Freq: Once | INTRAVENOUS | Status: AC | PRN
  Administered 2024-04-17: 75 mL via INTRAVENOUS

## 2024-04-17 MED ORDER — OXYCODONE-ACETAMINOPHEN 5-325 MG PO TABS
1.0000 | ORAL_TABLET | ORAL | Status: DC | PRN
  Administered 2024-04-17: 1 via ORAL
  Filled 2024-04-17: qty 1

## 2024-04-17 MED ORDER — MORPHINE SULFATE (PF) 4 MG/ML IV SOLN
4.0000 mg | Freq: Once | INTRAVENOUS | Status: AC
  Administered 2024-04-17: 4 mg via INTRAVENOUS
  Filled 2024-04-17: qty 1

## 2024-04-17 NOTE — ED Triage Notes (Signed)
 Patient reports severe pelvic pain and cramping that goes into her back.  Went to [facility] yesterday and had not problems.  Patient does have an IUD

## 2024-04-17 NOTE — ED Provider Notes (Signed)
 [place] [facility] AT [facility] Provider Note   CSN: [id number] Arrival date & time: 04/17/24  1312     Patient presents with: Abdominal Pain   Danielle Gonzalez is a 38 y.o. female.  She is here with a complaint of severe pelvic pain that started this morning.  She had an IUD placed about 6 weeks ago and had a follow-up appointment with her gynecologist yesterday and was told everything was fine.  No fevers chills diarrhea constipation or urinary symptoms.  No abnormal bleeding.  No known trauma.  She has had some nausea but if she feels it is related to the pain.  10 out of 10 pain.  No prior abdominal surgeries  {Add pertinent medical, surgical, social history, OB history to YEP:[id number]} The history is provided by the patient.  Abdominal Pain Pain location:  Suprapubic Pain quality: aching, cramping and stabbing   Pain severity:  Severe Onset quality:  Sudden Duration:  9 hours Timing:  Constant Progression:  Unchanged Chronicity:  New Context: not trauma   Relieved by:  Nothing Associated symptoms: nausea   Associated symptoms: no chills, no constipation, no cough, no diarrhea, no dysuria, no fever, no shortness of breath and no vaginal bleeding   Risk factors: not pregnant        Prior to Admission medications   Medication Sig Start Date End Date Taking? Authorizing Provider  albuterol  (VENTOLIN  HFA) 108 (90 Base) MCG/ACT inhaler Inhale 1-2 puffs into the lungs every 6 (six) hours as needed for wheezing or shortness of breath. 07/05/21   Merilee Andrea CROME, NP  fexofenadine (ALLEGRA) 180 MG tablet Take 180 mg by mouth daily.    [provider]  ibuprofen  (ADVIL ,MOTRIN ) 200 MG tablet Take 600 mg by mouth every 8 (eight) hours as needed for fever, headache, mild pain, moderate pain or cramping.    [provider]  IRON PO Take 1 tablet by mouth daily.    [provider]  Multiple Vitamin (MULTIVITAMIN WITH MINERALS) TABS tablet Take  1 tablet by mouth daily.    [provider]  valACYclovir HCl (VALTREX PO) Take by mouth.    [provider]    Allergies: Patient has no known allergies.    Review of Systems  Constitutional:  Negative for chills and fever.  Respiratory:  Negative for cough and shortness of breath.   Gastrointestinal:  Positive for abdominal pain and nausea. Negative for constipation and diarrhea.  Genitourinary:  Negative for dysuria and vaginal bleeding.    Updated Vital Signs BP 112/69 (BP Location: Right Arm)   Pulse 90   Temp 98.2 F (36.8 C) (Oral)   Resp 17   Ht 5' 5 (1.651 m)   Wt 63.5 kg   SpO2 98%   BMI 23.30 kg/m   Physical Exam Vitals and nursing note reviewed.  Constitutional:      General: She is not in acute distress.    Appearance: Normal appearance. She is well-developed.  HENT:     Head: Normocephalic and atraumatic.  Eyes:     Conjunctiva/sclera: Conjunctivae normal.  Cardiovascular:     Rate and Rhythm: Normal rate and regular rhythm.     Heart sounds: No murmur heard. Pulmonary:     Effort: Pulmonary effort is normal. No respiratory distress.     Breath sounds: Normal breath sounds. No stridor. No wheezing.  Abdominal:     Tenderness: There is abdominal tenderness in the right lower quadrant, suprapubic area and  left lower quadrant. There is no guarding or rebound.  Musculoskeletal:        General: No tenderness or deformity.     Cervical back: Neck supple.  Skin:    General: Skin is warm and dry.  Neurological:     General: No focal deficit present.     Mental Status: She is alert.     GCS: GCS eye subscore is 4. GCS verbal subscore is 5. GCS motor subscore is 6.     (all labs ordered are listed, but only abnormal results are displayed) Labs Reviewed  COMPREHENSIVE METABOLIC PANEL WITH GFR - Abnormal; Notable for the following components:      Result Value   CO2 19 (*)    Glucose, Bld 103 (*)    Alkaline Phosphatase 33 (*)    All  other components within normal limits  URINALYSIS, ROUTINE W REFLEX MICROSCOPIC - Abnormal; Notable for the following components:   APPearance HAZY (*)    Protein, ur 30 (*)    All other components within normal limits  CBC WITH DIFFERENTIAL/PLATELET - Abnormal; Notable for the following components:   WBC 12.1 (*)    Neutro Abs 9.7 (*)    Abs Immature Granulocytes 0.10 (*)    All other components within normal limits  I-STAT CHEM 8, ED - Abnormal; Notable for the following components:   Glucose, Bld 106 (*)    Calcium, Ion 1.12 (*)    TCO2 19 (*)    All other components within normal limits  LIPASE, BLOOD  HCG, QUANTITATIVE, PREGNANCY    EKG: None  Radiology: No results found.  {Document cardiac monitor, telemetry assessment procedure when appropriate:32947} Procedures   Medications Ordered in the ED  oxyCODONE -acetaminophen  (PERCOCET/ROXICET) 5-325 MG per tablet 1 tablet (1 tablet Oral Given 04/17/24 1349)  HYDROmorphone  (DILAUDID ) injection 1 mg (has no administration in time range)  ondansetron  (ZOFRAN ) injection 4 mg (has no administration in time range)  ondansetron  (ZOFRAN -ODT) disintegrating tablet 4 mg (4 mg Oral Given 04/17/24 1349)      {Click here for ABCD2, HEART and other calculators REFRESH Note before signing:1}                              Medical Decision Making Amount and/or Complexity of Data Reviewed Labs: ordered. Radiology: ordered.  Risk Prescription drug management.   This patient complains of ***; this involves an extensive number of treatment Options and is a complaint that carries with it a high risk of complications and morbidity. The differential includes ***  I ordered, reviewed and interpreted labs, which included *** I ordered medication *** and reviewed PMP when indicated. I ordered imaging studies which included *** and I independently    visualized and interpreted imaging which showed *** Additional history obtained from  *** Previous records obtained and reviewed *** I consulted *** and discussed lab and imaging findings and discussed disposition.  Cardiac monitoring reviewed, *** Social determinants considered, *** Critical Interventions: ***  After the interventions stated above, I reevaluated the patient and found *** Admission and further testing considered, ***   {Document critical care time when appropriate  Document review of labs and clinical decision tools ie CHADS2VASC2, etc  Document your independent review of radiology images and any outside records  Document your discussion with family members, caretakers and with consultants  Document social determinants of health affecting pt's care  Document your decision making why or why  not admission, treatments were needed:32947:::1}   Final diagnoses:  None    ED Discharge Orders     None

## 2024-04-17 NOTE — Discharge Instructions (Addendum)
 You were seen in the emergency department for lower abdominal pain.  You had an ultrasound, CAT scan, lab work and urinalysis that did not show an obvious explanation for your symptoms.  You did have a uterine fibroid, and your IUD was in good position.  I reviewed these findings with Dr. Marne, covering for Dr. Dannielle.  We are prescribing you some ibuprofen  and narcotic pain medication.  Please follow-up with your gynecologist as soon as possible.  Return to the emergency department if worsening pain or fever or other concerning symptoms.

## 2024-04-17 NOTE — ED Triage Notes (Signed)
 Pt here from home with c/o pelvic pain radiating to her back , no abnormal bleeding ot urinary problems noted

## 2024-04-17 NOTE — ED Notes (Signed)
 Patient transported to CT
